# Patient Record
Sex: Female | Born: 1970 | Race: White | Hispanic: No | Marital: Married | Smoking: Former smoker
Health system: Southern US, Community
[De-identification: ages and names within clinical notes are randomized; demographics above are authoritative.]

## PROBLEM LIST (undated history)

## (undated) DIAGNOSIS — K602 Anal fissure, unspecified: Secondary | ICD-10-CM

## (undated) DIAGNOSIS — E669 Obesity, unspecified: Secondary | ICD-10-CM

## (undated) DIAGNOSIS — I10 Essential (primary) hypertension: Secondary | ICD-10-CM

## (undated) DIAGNOSIS — K589 Irritable bowel syndrome without diarrhea: Secondary | ICD-10-CM

## (undated) DIAGNOSIS — G4734 Idiopathic sleep related nonobstructive alveolar hypoventilation: Secondary | ICD-10-CM

## (undated) HISTORY — DX: Anal fissure, unspecified: K60.2

## (undated) HISTORY — DX: Irritable bowel syndrome, unspecified: K58.9

## (undated) HISTORY — DX: Obesity, unspecified: E66.9

## (undated) HISTORY — PX: COLONOSCOPY: SHX174

## (undated) HISTORY — DX: Essential (primary) hypertension: I10

---

## 2004-11-01 ENCOUNTER — Ambulatory Visit (HOSPITAL_COMMUNITY): Admission: RE | Admit: 2004-11-01 | Discharge: 2004-11-01 | Payer: Self-pay | Admitting: Family Medicine

## 2007-12-26 HISTORY — PX: TUBAL LIGATION: SHX77

## 2008-09-22 ENCOUNTER — Ambulatory Visit (HOSPITAL_COMMUNITY): Admission: RE | Admit: 2008-09-22 | Discharge: 2008-09-22 | Payer: Self-pay | Admitting: Family Medicine

## 2008-09-22 ENCOUNTER — Encounter (INDEPENDENT_AMBULATORY_CARE_PROVIDER_SITE_OTHER): Payer: Self-pay | Admitting: Family Medicine

## 2013-12-25 DIAGNOSIS — D229 Melanocytic nevi, unspecified: Secondary | ICD-10-CM

## 2013-12-25 HISTORY — DX: Melanocytic nevi, unspecified: D22.9

## 2016-08-30 ENCOUNTER — Other Ambulatory Visit (HOSPITAL_COMMUNITY): Payer: Self-pay | Admitting: Internal Medicine

## 2016-08-30 DIAGNOSIS — R899 Unspecified abnormal finding in specimens from other organs, systems and tissues: Secondary | ICD-10-CM

## 2016-09-05 ENCOUNTER — Ambulatory Visit (HOSPITAL_COMMUNITY)
Admission: RE | Admit: 2016-09-05 | Discharge: 2016-09-05 | Disposition: A | Discharge status: Home / Self Care | Payer: BLUE CROSS/BLUE SHIELD | Source: Ambulatory Visit | Attending: Internal Medicine | Admitting: Internal Medicine

## 2016-09-05 DIAGNOSIS — R899 Unspecified abnormal finding in specimens from other organs, systems and tissues: Secondary | ICD-10-CM

## 2017-06-01 IMAGING — US US THYROID
1 series · 13 of 25 positions shown · non-contrast
Comparison: None.

CLINICAL DATA: 45-year-old female with abnormal thyroid labs

EXAM:
THYROID ULTRASOUND
TECHNIQUE: Ultrasound examination of the thyroid gland and adjacent soft
tissues was performed.

[Series 1: us thyroid · 0.09mm/px · 13 of 53 slices shown]
[im 1/53]
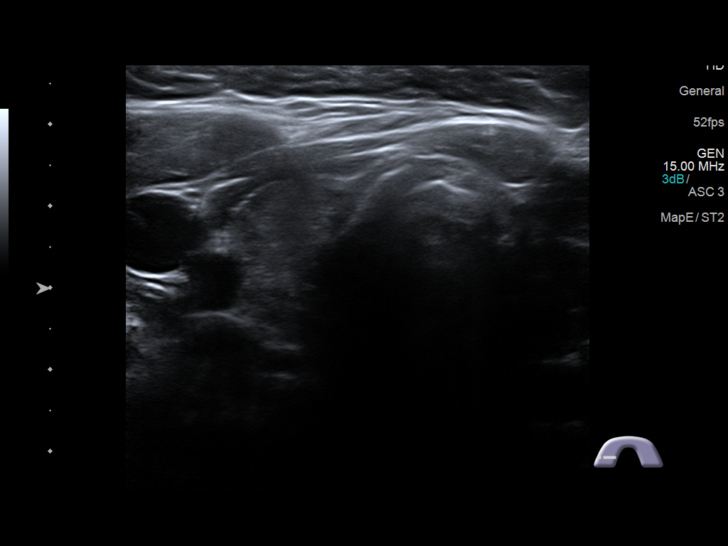
[im 5/53]
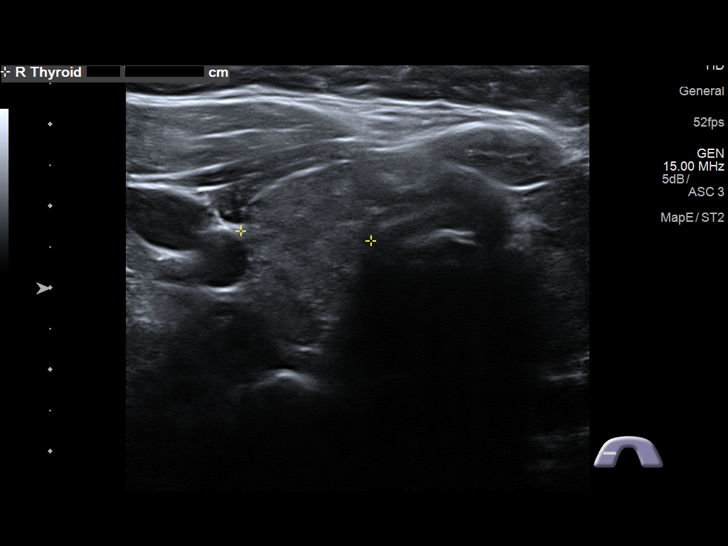
[im 9/53]
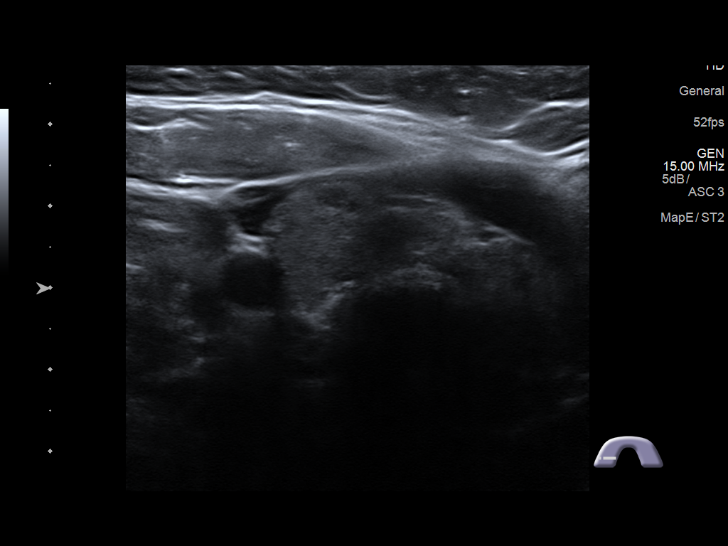
[im 14/53]
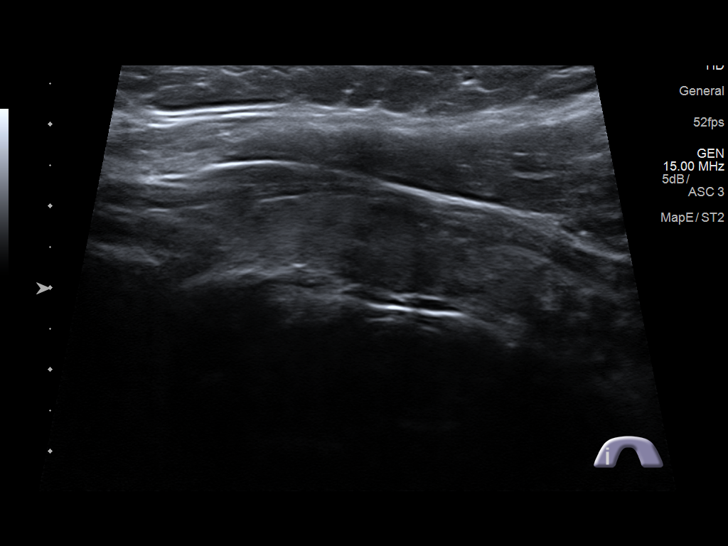
[im 18/53]
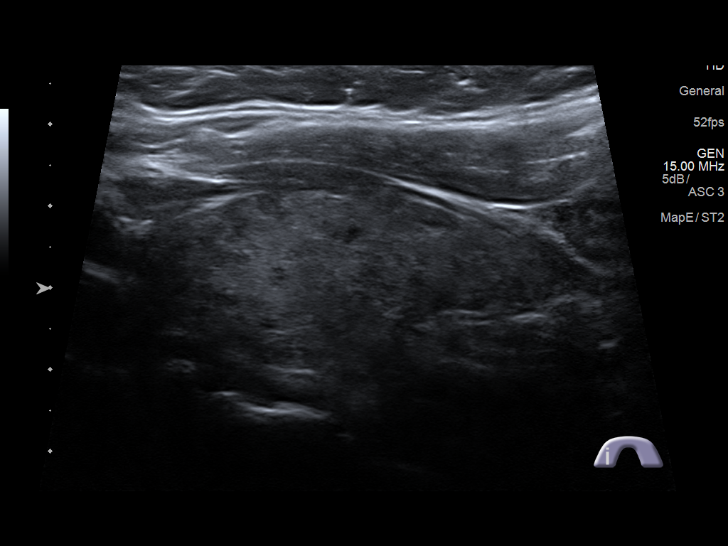
[im 22/53]
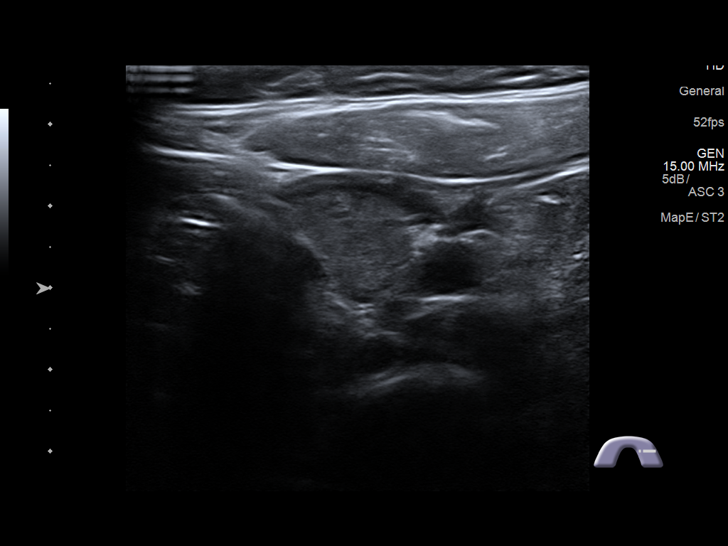
[im 27/53]
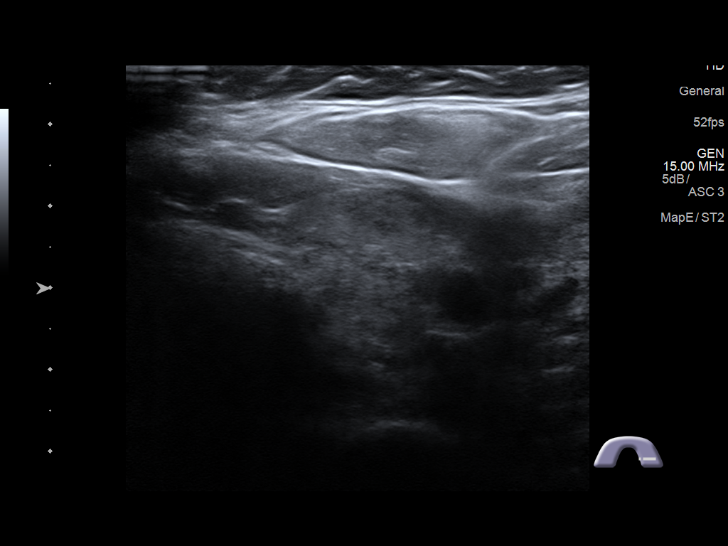
[im 31/53]
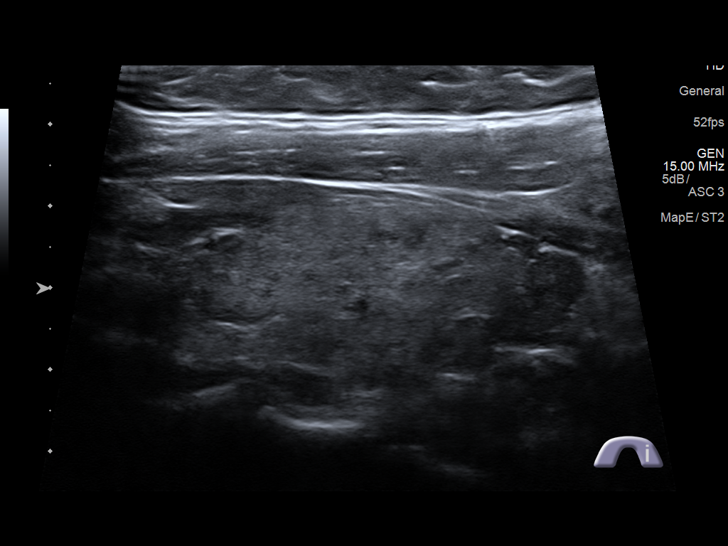
[im 35/53]
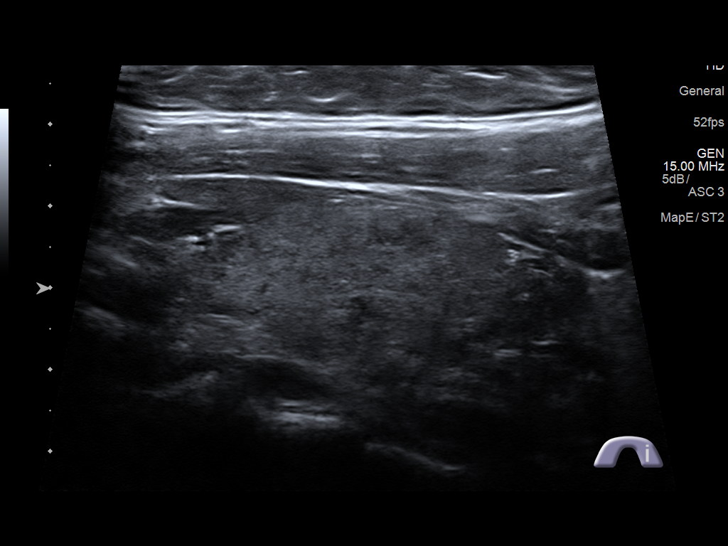
[im 40/53]
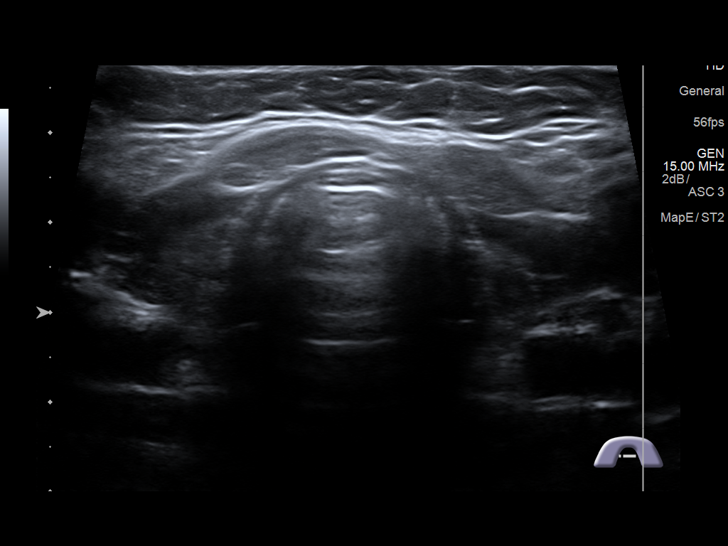
[im 44/53]
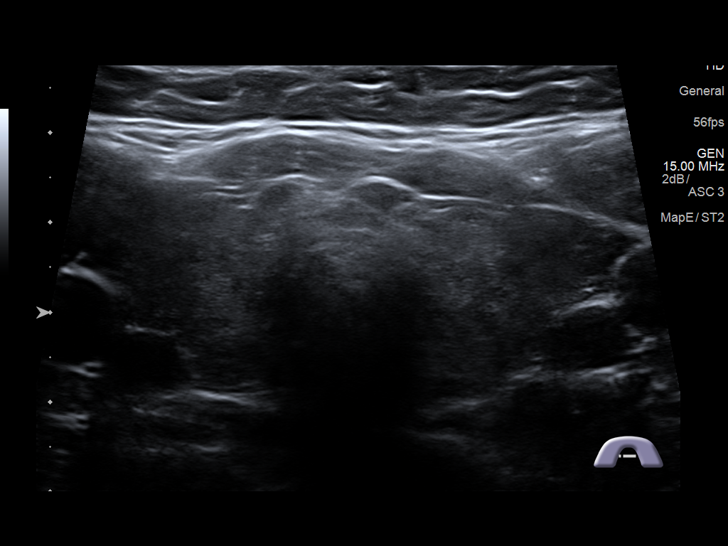
[im 48/53]
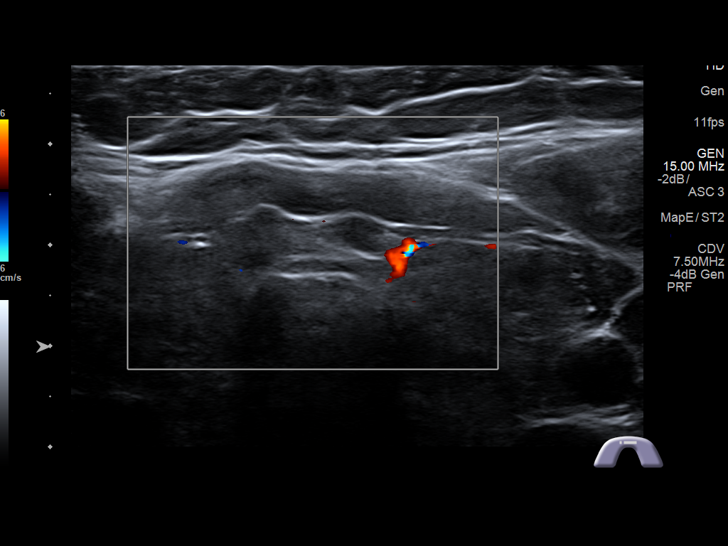
[im 53/53]
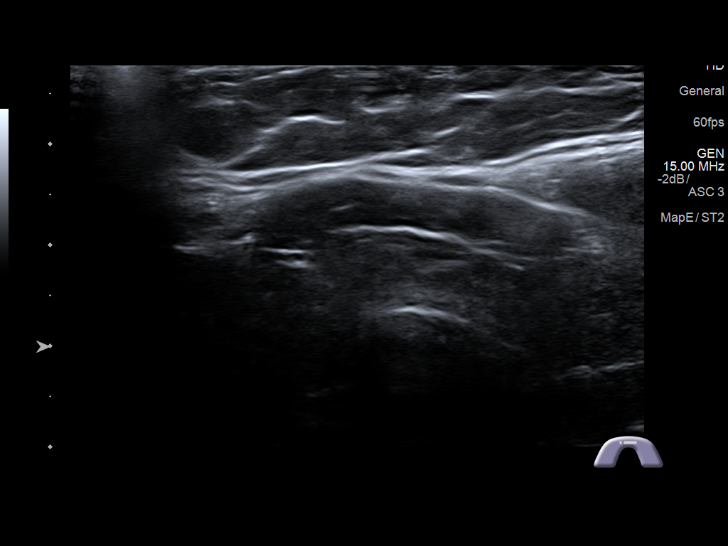

[13 of 25 positions shown; findings below may reference images not displayed]

FINDINGS: Parenchymal Echotexture: Markedly heterogenous

Estimated total number of nodules >/= 1 cm: 0

Number of spongiform nodules > 2 cm not described below (TR1): 0

Number of mixed cystic and solid nodules > 1.5 cm not described
below (TR2): 0

_________________________________________________________

Isthmus: 0.7 cm

Nodule # 1:

Location: Isthmus; Inferior

Size: 0.5 x 0.3 x 0.8 cm

Composition: solid/almost completely solid (2)

Echogenicity: hypoechoic (2)

Shape: not taller-than-wide (0)

Margins: smooth (0)

Echogenic foci: none (0)

ACR TI-RADS total points: 4.

ACR TI-RADS risk category: TR4 (4-6 points).

ACR TI-RADS recommendations:

Given size (<0.9 cm) and appearance, this nodule does NOT meet
TI-RADS criteria for biopsy or dedicated follow-up.

_________________________________________________________

Right lobe: 5.4 x 2.6 x 1.6 cm

No discrete nodules are identified within the right lobe of the
thyroid.

_________________________________________________________

Left lobe: 5.4 x 2.9 x 2.0 cm

No discrete nodules are identified within the left lobe of the
thyroid.
IMPRESSION: 1. Markedly heterogeneous thyroid gland may represent chronic
thyroiditis or diffuse goitrous change.
2. One incidentally detected sub cm nodule in the thyroid isthmus
does not meet criteria for biopsy 0 4 dedicated follow-up imaging.

The above is in keeping with the ACR TI-RADS recommendations - [HOSPITAL] 6985;[DATE].

## 2020-05-18 ENCOUNTER — Encounter: Payer: Self-pay | Admitting: Gastroenterology

## 2020-06-23 ENCOUNTER — Encounter: Payer: Self-pay | Admitting: Gastroenterology

## 2020-06-23 ENCOUNTER — Other Ambulatory Visit (INDEPENDENT_AMBULATORY_CARE_PROVIDER_SITE_OTHER): Payer: Commercial Managed Care - PPO

## 2020-06-23 ENCOUNTER — Ambulatory Visit (INDEPENDENT_AMBULATORY_CARE_PROVIDER_SITE_OTHER): Payer: Commercial Managed Care - PPO | Admitting: Gastroenterology

## 2020-06-23 VITALS — BP 142/82 | HR 63 | Ht 65.0 in | Wt 259.0 lb

## 2020-06-23 DIAGNOSIS — K602 Anal fissure, unspecified: Secondary | ICD-10-CM

## 2020-06-23 DIAGNOSIS — K580 Irritable bowel syndrome with diarrhea: Secondary | ICD-10-CM

## 2020-06-23 LAB — IGA: IgA: 193 mg/dL (ref 68–378)

## 2020-06-23 MED ORDER — HYOSCYAMINE SULFATE 0.125 MG SL SUBL: 0.1250 mg | SUBLINGUAL_TABLET | Freq: Every day | SUBLINGUAL | 2 refills | Status: DC

## 2020-06-23 NOTE — Progress Notes (Addendum)
[facility] Consult Note:  History: Katie Moore 06/23/2020  Referring provider: Celene Squibb, MD  Reason for consult/chief complaint: Irritable Bowel Syndrome (with diarrhea), Diarrhea, and Rectal Pain (hx of anal fissures)   Subjective  HPI: This is a very pleasant 49 year old woman referred by primary care for chronic digestive issues and history of anal fissure. For at least 5 years she has been bothered by bloating, diarrhea and indigestion. In 2018 the diarrhea was bothering her so much that she developed an anal fissure and saw Dr. Earlean Shawl. Colonoscopy was performed at that time that she recalls was normal, she does not know if any biopsies were taken. She was prescribed nitroglycerin ointment for the fissure, and would intermittently take dicyclomine or hyoscyamine for relief of symptoms. She tried a low gluten diet but is not sure she saw much improvement. Symptoms seem to improve until sometime earlier this year when she started a new diet including some supplements. More diarrhea was occurring, so she stopped that diet and the supplements. At this point, her typical bowel pattern is 1-2 BMs in the morning, though some days it may start to become more loose later in the day. Some days she feels well overall, other times has loose stool preceded by feelings of lower abdominal pressure. After bowel movement she might feel incompletely evacuated. It has been frustrating because Jashanti is not sure if there are specific food triggers. She takes the antispasmodic medication occasionally, but after symptoms have occurred and she is not sure how much it helps. She is concerned that the symptoms may escalate to the point she would get another fissure.   ROS:  Review of Systems  Constitutional: Negative for appetite change and unexpected weight change.  HENT: Negative for mouth sores and voice change.   Eyes: Negative for pain and redness.  Respiratory: Negative for  cough and shortness of breath.   Cardiovascular: Negative for chest pain and palpitations.  Genitourinary: Negative for dysuria and hematuria.  Musculoskeletal: Negative for arthralgias and myalgias.  Skin: Negative for pallor and rash.  Neurological: Negative for weakness and headaches.  Hematological: Negative for adenopathy.     Past Medical History: Past Medical History:  Diagnosis Date  . Anal fissure   . Hypertension   . IBS (irritable bowel syndrome)    with diarrhea  . Obesity      Past Surgical History: Past Surgical History:  Procedure Laterality Date  . CESAREAN SECTION     x 2  . TUBAL LIGATION  2009     Family History: Family History  Problem Relation Age of Onset  . Heart disease Father   . Prostate cancer Maternal Grandfather   . Ulcerative colitis Maternal Grandfather   . Heart disease Paternal Grandfather     Social History: Social History   Socioeconomic History  . Marital status: Married    Spouse name: Not on file  . Number of children: Not on file  . Years of education: Not on file  . Highest education level: Not on file  Occupational History  . Not on file  Tobacco Use  . Smoking status: Former Research scientist (life sciences)  . Smokeless tobacco: Never Used  Vaping Use  . Vaping Use: Never used  Substance and Sexual Activity  . Alcohol use: Not Currently  . Drug use: Never  . Sexual activity: Not on file  Other Topics Concern  . Not on file  Social History Narrative  . Not on file   Social  Determinants of Health   Financial Resource Strain:   . Difficulty of Paying Living Expenses:   Food Insecurity:   . Worried About Charity fundraiser in the Last Year:   . Arboriculturist in the Last Year:   Transportation Needs:   . Film/video editor (Medical):   Marland Kitchen Lack of Transportation (Non-Medical):   Physical Activity:   . Days of Exercise per Week:   . Minutes of Exercise per Session:   Stress:   . Feeling of Stress :   Social Connections:     . Frequency of Communication with Friends and Family:   . Frequency of Social Gatherings with Friends and Family:   . Attends Religious Services:   . Active Member of Clubs or Organizations:   . Attends Archivist Meetings:   Marland Kitchen Marital Status:     Allergies: Allergies  Allergen Reactions  . Keflex [Cephalexin]   . Penicillins     Outpatient Meds: Current Outpatient Medications  Medication Sig Dispense Refill  . AMBULATORY NON FORMULARY MEDICATION Medication Name: Nitroglycerin 0.2% ointment uses pea sized amount to rectum every night at bedtime    . Cholecalciferol (D3-1000 PO) Take by mouth. Take once daily    . esomeprazole (NEXIUM) 20 MG capsule Take 20 mg by mouth daily at 12 noon. Takes every night at bedtime    . hydrochlorothiazide (MICROZIDE) 12.5 MG capsule Take 12.5 mg by mouth daily.    Marland Kitchen lisinopril (ZESTRIL) 10 MG tablet Take 10 mg by mouth daily.    . hyoscyamine (LEVSIN SL) 0.125 MG SL tablet Place 1 tablet (0.125 mg total) under the tongue daily. 30 tablet 2   No current facility-administered medications for this visit.      ___________________________________________________________________ Objective   Exam:  BP (!) 142/82 (BP Location: Left Arm, Patient Position: Sitting)   Pulse 63   Ht 5\' 5"  (1.651 m)   Wt 259 lb (117.5 kg)   SpO2 99%   BMI 43.10 kg/m    General: Well-appearing, pleasant  Eyes: sclera anicteric, no redness  ENT: oral mucosa moist without lesions, no cervical or supraclavicular lymphadenopathy  CV: RRR without murmur, S1/S2, no JVD, no peripheral edema  Resp: clear to auscultation bilaterally, normal RR and effort noted  GI: soft, no tenderness, with active bowel sounds. No guarding or palpable organomegaly noted.  Skin; warm and dry, no rash or jaundice noted  Neuro: awake, alert and oriented x 3. Normal gross motor function and fluent speech  Labs:  No data for review at today's  visit  Assessment: Encounter Diagnoses  Name Primary?  . Irritable bowel syndrome with diarrhea Yes  . 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 Less likely bile acid diarrhea, no apparent risk factors for SIBO. The chronic nature and treatment of IBS were discussed. The cause is not completely understood, but is likely to be a combination of genetics, diet, stress, visceral hypersensitivity and the gut microbiome.  The available treatments aim to control symptoms even if unable to "cure" the condition.  While not physically harmful, IBS can have a significant impact on quality of life.   Plan: Try the hyoscyamine daily, either in the morning or by midday meal. Some written dietary advice given regarding bloating gas and diarrhea Celiac labs Clinic visit in 6 weeks, portal message to me in about 3 weeks with update on symptoms and experience taking the hyoscyamine daily. Perhaps med adjustments may  be necessary before the office visit.  Thank you for the courtesy of this consult.  Please call me with any questions or concerns.  Nelida Meuse III  CC: Referring provider noted above ------------------------------------ Record review addendum 06/29/20:  [facility] Campbell 12/13/2017 "screening" colonoscopy - normal to TI except hypertrophied anal papilla(e) and external hemorrhoids.  No Bx taken.  Office note dated 12/04/2017 re: anal pain and suspected fissure.   Wilfrid Lund, MD    [facility]

## 2020-06-23 NOTE — Patient Instructions (Addendum)
If you are age 49 or older, your body mass index should be between 23-30. Your Body mass index is 43.1 kg/m. If this is out of the aforementioned range listed, please consider follow up with your Primary Care Provider.  If you are age 10 or younger, your body mass index should be between 19-25. Your Body mass index is 43.1 kg/m. If this is out of the aformentioned range listed, please consider follow up with your Primary Care Provider.   Your provider has requested that you go to the basement level for lab work before leaving today. Press "B" on the elevator. The lab is located at the first door on the left as you exit the elevator.  Due to recent changes in healthcare laws, you may see the results of your imaging and laboratory studies on MyChart before your provider has had a chance to review them.  We understand that in some cases there may be results that are confusing or concerning to you. Not all laboratory results come back in the same time frame and the provider may be waiting for multiple results in order to interpret others.  Please give [place] 48 hours in order for your provider to thoroughly review all the results before contacting the office for clarification of your results.   Follow up in 6 weeks   It was a pleasure to see you today!  Dr. Loletha Carrow   Food Guidelines for those with chronic digestive trouble:  Many people have difficulty digesting certain foods, causing a variety of distressing and embarrassing symptoms such as abdominal pain, bloating and gas.  These foods may need to be avoided or consumed in small amounts.  Here are some tips that might be helpful for you.  1.   Lactose intolerance is the difficulty or complete inability to digest lactose, the natural sugar in milk and anything made from milk.  This condition is harmless, common, and can begin any time during life.  Some people can digest a modest amount of lactose while others cannot tolerate any.  Also, not all dairy  products contain equal amounts of lactose.  For example, hard cheeses such as parmesan have less lactose than soft cheeses such as cheddar.  Yogurt has less lactose than milk or cheese.  Many packaged foods (even many brands of bread) have milk, so read ingredient lists carefully.  It is difficult to test for lactose intolerance, so just try avoiding lactose as much as possible for a week and see what happens with your symptoms.  If you seem to be lactose intolerant, the best plan is to avoid it (but make sure you get calcium from another source).  The next best thing is to use lactase enzyme supplements, available over the counter everywhere.  Just know that many lactose intolerant people need to take several tablets with each serving of dairy to avoid symptoms.  Lastly, a lot of restaurant food is made with milk or butter.  Many are things you might not suspect, such as mashed potatoes, rice and pasta (cooked with butter) and "grilled" items.  If you are lactose intolerant, it never hurts to ask your server what has milk or butter.  2.   Fiber is an important part of your diet, but not all fiber is well-tolerated.  Insoluble fiber such as bran is often consumed by normal gut bacteria and converted into gas.  Soluble fiber such as oats, squash, carrots and green beans are typically tolerated better.  3.  Some types of carbohydrates can be poorly digested.  Examples include: fructose (apples, cherries, pears, raisins and other dried fruits), fructans (onions, zucchini, large amounts of wheat), sorbitol/mannitol/xylitol and sucralose/Splenda (common artificial sweeteners), and raffinose (lentils, broccoli, cabbage, asparagus, brussel sprouts, many types of beans).  Do a Development worker, community for [employer] and you will find helpful information. Beano, a dietary supplement, will often help with raffinose-containing foods.  As with lactase tablets, you may need several per serving.  4.   Whenever possible, avoid  processed food&meats and chemical additives.  High fructose corn syrup, a common sweetener, may be difficult to digest.  Eggs and soy (comes from the soybean, and added to many foods now) are other common bloating/gassy foods.  5.  Regarding gluten:  gluten is a protein mainly found in wheat, but also rye and barley.  There is a condition called celiac sprue, which is an inflammatory reaction in the small intestine causing a variety of digestive symptoms.  Blood testing is highly reliable to look for this condition, and sometimes upper endoscopy with small bowel biopsies may be necessary to make the diagnosis.  Many patients who test negative for celiac sprue report improvement in their digestive symptoms when they switch to a gluten-free diet.  However, in these "non-celiac gluten sensitive" patients, the true role of gluten in their symptoms is unclear.  Reducing carbohydrates in general may decrease the gas and bloating caused when gut bacteria consume carbs. Also, some of these patients may actually be intolerant of the baker's yeast in bread products rather than the gluten.  Flatbread and other reduced yeast breads might therefore be tolerated.  There is no specific testing available for most food intolerances, which are discovered mainly by dietary elimination.  Please do not embark on a gluten free diet unless directed by your doctor, as it is highly restrictive, and may lead to nutritional deficiencies if not carefully monitored.  Lastly, beware of internet claims offering "personalized" tests for food intolerances.  Such testing has no reliable scientific evidence to support its reliability and correlation to symptoms.    6.  The best advice is old advice, especially for those with chronic digestive trouble - try to eat "clean".  Balanced diet, avoid processed food, plenty of fruits and vegetables, cut down the sugar, minimal alcohol, avoid tobacco. Make time to care for yourself, get enough sleep,  exercise when you can, reduce stress.  Your guts will thank you for it.   - Dr. [facility]

## 2020-06-24 LAB — TISSUE TRANSGLUTAMINASE, IGA: (tTG) Ab, IgA: 1 U/mL

## 2020-09-08 ENCOUNTER — Ambulatory Visit (INDEPENDENT_AMBULATORY_CARE_PROVIDER_SITE_OTHER): Payer: Commercial Managed Care - PPO | Admitting: Gastroenterology

## 2020-09-08 ENCOUNTER — Encounter: Payer: Self-pay | Admitting: Gastroenterology

## 2020-09-08 VITALS — BP 120/80 | HR 72 | Ht 65.0 in | Wt 259.0 lb

## 2020-09-08 DIAGNOSIS — K529 Noninfective gastroenteritis and colitis, unspecified: Secondary | ICD-10-CM

## 2020-09-08 DIAGNOSIS — K602 Anal fissure, unspecified: Secondary | ICD-10-CM

## 2020-09-08 DIAGNOSIS — K6289 Other specified diseases of anus and rectum: Secondary | ICD-10-CM | POA: Diagnosis not present

## 2020-09-08 MED ORDER — AMBULATORY NON FORMULARY MEDICATION: 1 refills | Status: DC

## 2020-09-08 NOTE — Progress Notes (Addendum)
[facility] Progress Note  Chief Complaint: Anal pain  Subjective  History: Seen for initial office consult 06/23/2020 with years of chronic diarrhea and prior anal fissure under care of Dr. Earlean Shawl in 2018. Celiac labs negative, written dietary advice given, trial of hyoscyamine prescribed. She has not felt better since the last visit, and still has urgent diarrhea with rectal pain.  She has intermittent nausea for about 15 minutes in the morning a couple days out of the week.  Records from Dr. Earlean Shawl were reviewed and included as an addendum to the recent office consult note.  Colonoscopy to the terminal ileum December 2018, no biopsies taken, hypertrophied anal papilla.  Subsequent office visit with him described anal fissure.  Katie Moore is feeling about the same since I last saw her.  Her diarrhea is relatively mild, she has a formed bowel movement in the morning followed by 1 or 2 loose stools within the next couple of hours.  That is been her pattern for years.  After the second or third BM she starts getting anal pain and spasm that may last for several hours and is very uncomfortable.  She has been taking some ibuprofen every morning with some relief.  She has occasional nausea without vomiting.  Denies rectal bleeding.  ROS: Cardiovascular:  no chest pain Respiratory: no dyspnea  The patient's Past Medical, Family and Social History were reviewed and are on file in the EMR.  Objective:  Med list reviewed  Current Outpatient Medications:  .  Cholecalciferol (D3-1000 PO), Take by mouth. Take once daily, Disp: , Rfl:  .  esomeprazole (NEXIUM) 20 MG capsule, Take 20 mg by mouth daily at 12 noon. Takes every night at bedtime, Disp: , Rfl:  .  hydrochlorothiazide (MICROZIDE) 12.5 MG capsule, Take 12.5 mg by mouth daily., Disp: , Rfl:  .  hyoscyamine (LEVSIN SL) 0.125 MG SL tablet, Place 1 tablet (0.125 mg total) under the tongue daily., Disp: 30 tablet, Rfl: 2 .  lisinopril  (ZESTRIL) 10 MG tablet, Take 10 mg by mouth daily., Disp: , Rfl:    Vital signs in last 24 hrs: Vitals:   09/08/20 0916  BP: 120/80  Pulse: 72    Physical Exam   HEENT: sclera anicteric, oral mucosa moist without lesions  Neck: supple, no thyromegaly, JVD or lymphadenopathy  Cardiac: RRR without murmurs, S1S2 heard, no peripheral edema  Pulm: clear to auscultation bilaterally, normal RR and effort noted  Abdomen: soft, no tenderness, with active bowel sounds. No guarding or palpable hepatosplenomegaly.  Skin; warm and dry, no jaundice or rash Rectal: Sentinel tag, tender (and probably deep) posterior anal fissure.  I was able to complete DRE Labs:   ___________________________________________ Radiologic studies:   ____________________________________________ OtherEarlean Shawl records as noted above _____________________________________________ Assessment & Plan  Assessment: Encounter Diagnoses  Name Primary?  Marland Kitchen Anal fissure Yes  . Chronic diarrhea   . Anal pain     I think she has mild IBS with some loose stool in the morning after her first formed stool, but no chronic abdominal pain. The biggest problem is this anal fissure which has probably been present at least intermittently for years.  It has been causing severe pain over least the last 6 months.  We discussed the nature of anal fissure and available treatments.  She has been using some nitroglycerin ointment prescribed by primary care but no lidocaine.  Plan: Instructions given for combination of lidocaine and nitroglycerin ointment.  Try 1  Imodium tablet in the morning for the next week.  I think that is unlikely to cause constipation, but if it does she will stop.  Referral to colorectal surgery for examination and consideration of EUA or pelvic MRI if they think there may be a more complex component to this.  30 minutes were spent on this encounter (including chart review, history/exam,  counseling/coordination of care, and documentation)  Katie Moore   Addendum 09/24/20:  Patient was seen by Dr. Marcello Moores at [place], who felt the fissure was improving and did not need additional therapy or testing.  Katie Lips, MD

## 2020-09-08 NOTE — Patient Instructions (Signed)
If you are age 49 or older, your body mass index should be between 23-30. Your Body mass index is 43.1 kg/m. If this is out of the aforementioned range listed, please consider follow up with your Primary Care Provider.  If you are age 60 or younger, your body mass index should be between 19-25. Your Body mass index is 43.1 kg/m. If this is out of the aformentioned range listed, please consider follow up with your Primary Care Provider.   We have sent a prescription for nitroglycerin 0.125% ointment to [facility]. You should apply a pea size amount to your rectum three times daily x 6-8 weeks.  Please use Recticare over the counter.  [facility]'s information is below: Address: [address] [place], [place] [address]  [phone]  *Please DO NOT go directly from our office to pick up this medication! Give the pharmacy 1 day to process the prescription as this is compounded and takes time to make.   It was a pleasure to see you today!  Dr. Loletha Carrow   Patient Drug Education for Nitroglycerin Ointment  Nitroglycerin ointment (NTG) is used to help heal anal fissures. The ointment relaxes the smooth muscle around the anus and promotes blood flow which helps heal the fissure (tear). The NTG reduces anal canal pressure, which diminishes pain and spasm. We use a diluted concentration of NTG (.125%) compared to the 2% that is typically used for heart patients, and this is why you need to obtain the medication from a pharmacy which will compound your prescription.  The NTG ointment should be applied 3 times per day, or as directed.  A pea-sized drop should be placed on the tip of your finger and then gently placed inside the anus. The finger should be inserted 1/3 - 1/2 its length and may be covered with a plastic glove or finger cot. You may use Vaseline to help coat the finger or dilute the ointment. If you are advised to mix the NTG with steroid ointment, limit the  steroids to one to two weeks.  The first few applications should be taken lying down, as mild light-headedness or a brief headache may occur.  It may take several weeks for the fissure to begin healing, and you will need to continue taking the medication after resolution of your symptoms.  It is important to continue the treatment for the entire time period - up to 3 months or as directed. It takes up to two years for the healing tissue to regain the normal skin strength. You will be advised to add fiber to your diet, increase water intake to 7-8 glasses per day, take relaxing baths or sitz baths, and avoid prolonged sitting and straining on the commode. Local anesthetic ointment may be added.  Initially, the anal fissure is very inflamed, which allows more of the NTG to get into the blood. This allows for a higher incidence of the most common side effect - a headache. It is usually brief and mild, but may require Tylenol or Advil. You may dilute the NTG further with Vaseline to decrease the headaches. As the treatment progresses and the fissure begins to heal, the headaches will tend to dissipate. Other side effects include lightheadedness, flushing, dizziness, nervousness, nausea, and vomiting. If any of these side effects persist or worsen, notify us promptly. Stop using the NTG and notify us immediately if you develop the rare side effects of severe dizziness, fainting, fast/pounding heartbeat, paleness, sweating, blurred vision, dry  mouth, dark urine, bluish lips/skin/nails, unusual tiredness, severe weakness, irregular heartbeat, seizures, or chest pain. Serious allergic reactions are unusual, but seek immediate medical attention if you develop a rash, swelling, dizziness, or trouble breathing.  Tell us if you are allergic to nitrates, have severe anemia, low blood pressure, dehydration, chronic heart failure, cardiomyopathy, recent heart attack, increased pressure in the brain, or exposure to  nitrates while on the job. Do not use NTG while driving or working around machinery if you are drowsy, dizzy, have lightheadedness, or blurred vision. Limit alcoholic beverages. To minimize dizziness and lightheadedness, get up slowly when rising from a sitting or lying position. The elderly may be more prone to dizziness and falling. While there are not adequate studies to confirm the safety of NTG in pregnant or breast feeding women, it has been used without incident so far. We recommend waiting at least one hour after applying the NTG ointment before breast feeding.  Do not use NTG ointment if you are taking drugs for sexual problems [e.g., sildenafil (Viagra), tadalafil (Cialis), vardenafil (Levitra)]. Use caution before taking cough-and-cold products, diet aids, or NSAIDs preparations because they may contain ingredients that could increase your blood pressure, cause a fast heartbeat, or increase chest pain (e.g., pseudoephedrine, phenylephrine, chlorpheniramine, diphenhydramine, clemastine, ibuprofen, and naproxen). Tell us if you drink alcohol, take alteplase, migraine drugs (ergotamine), water pills/diuretics such as furosemide or hydrochlorothiazide, or other drugs for high blood pressure (beta blockers, calcium channel blockers, ACE inhibitors).  Store the NTG at room temperature and keep away from light and moisture. Close the container tightly after each use. Do not store in the bathroom. Keep away from children and pets. If you have any questions or problems please call us at  [phone].

## 2020-09-22 ENCOUNTER — Other Ambulatory Visit: Payer: Self-pay

## 2020-09-22 MED ORDER — HYOSCYAMINE SULFATE 0.125 MG SL SUBL: 0.1250 mg | SUBLINGUAL_TABLET | Freq: Every day | SUBLINGUAL | 2 refills | Status: DC

## 2021-01-17 MED ORDER — HYOSCYAMINE SULFATE 0.125 MG SL SUBL: 0.1250 mg | SUBLINGUAL_TABLET | Freq: Every day | SUBLINGUAL | 2 refills | Status: DC

## 2021-06-13 ENCOUNTER — Ambulatory Visit
Admission: EM | Admit: 2021-06-13 | Discharge: 2021-06-13 | Disposition: A | Discharge status: Home / Self Care | Payer: Commercial Managed Care - PPO | Attending: Family Medicine | Admitting: Family Medicine

## 2021-06-13 ENCOUNTER — Encounter: Payer: Self-pay | Admitting: Emergency Medicine

## 2021-06-13 ENCOUNTER — Other Ambulatory Visit: Payer: Self-pay

## 2021-06-13 DIAGNOSIS — L02211 Cutaneous abscess of abdominal wall: Secondary | ICD-10-CM

## 2021-06-13 MED ORDER — DOXYCYCLINE HYCLATE 100 MG PO CAPS: 100.0000 mg | ORAL_CAPSULE | Freq: Two times a day (BID) | ORAL | 0 refills | Status: AC

## 2021-06-13 NOTE — ED Triage Notes (Signed)
Area to RT stomach at waistband that is red, painful and has been draining pus for past couple of days.  Area has been there x 1 month.  Has taken 1 round of abx about a month ago.

## 2021-06-13 NOTE — Discharge Instructions (Addendum)
Start doxycycline twice a day for 14 days.  I have placed a referral for you to be evaluated by Dr. Aviva Signs at [facility] surgical specialty here in [place].  Someone from their office will contact you to schedule you for an appointment.  In the meantime keep area covered.  Given the incision sites I expect the abscess to drain although it may simply be blood as I was unable to retrieve much purulent drainage today. You may continue to shower and bathe as usual and redress incision sites after bathing.

## 2021-06-13 NOTE — ED Provider Notes (Signed)
[facility]    CSN: [id number] Arrival date & time: 06/13/21  1424      History   Chief Complaint Chief Complaint  Patient presents with  . Abscess    HPI Katie Moore is a 50 y.o. female.   HPI Patient presents today with a abdominal wall abscess which started as a small dime size mass on her right lower abdomen.  Over the last 3 months the area has expanded and become hardened.  She reports redness and irritation along with some mild pain started approximately few weeks ago when her occupational health NP treated her with 7 days of doxycycline.  [place] the [place] the area became increasingly red and she saw scants amount of pus draining from the site.  The area has expanded in diameter continues experience moderate pain at the site and has had some intermittent pus draining from the site.  She is afebrile denies any nausea or vomiting.  She is not a diabetic.  Past Medical History:  Diagnosis Date  . Anal fissure   . Hypertension   . IBS (irritable bowel syndrome)    with diarrhea  . Obesity     There are no problems to display for this patient.   Past Surgical History:  Procedure Laterality Date  . CESAREAN SECTION     x 2  . TUBAL LIGATION  2009    OB History   No obstetric history on file.      Home Medications    Prior to Admission medications   Medication Sig Start Date End Date Taking? Authorizing Provider  doxycycline (VIBRAMYCIN) 100 MG capsule Take 1 capsule (100 mg total) by mouth 2 (two) times daily for 14 days. 06/13/21 06/27/21 Yes Scot Jun, FNP  AMBULATORY NON FORMULARY MEDICATION Nitroglycerine ointment 0.125 %  Apply a pea sized amount internally four times daily. Dispense 30 GM 1 refill 09/08/20   Doran Stabler, MD  Cholecalciferol (D3-1000 PO) Take by mouth. Take once daily    [provider]  esomeprazole (NEXIUM) 20 MG capsule Take 20 mg by mouth daily at 12 noon. Takes every night at bedtime     [provider]  hydrochlorothiazide (MICROZIDE) 12.5 MG capsule Take 12.5 mg by mouth daily.    [provider]  hyoscyamine (LEVSIN SL) 0.125 MG SL tablet Place 1 tablet (0.125 mg total) under the tongue daily. 01/17/21   Nelida Meuse III, MD  lisinopril (ZESTRIL) 10 MG tablet Take 10 mg by mouth daily.    [provider]    Family History Family History  Problem Relation Age of Onset  . Heart disease Father   . Prostate cancer Maternal Grandfather   . Ulcerative colitis Maternal Grandfather   . Heart disease Paternal Grandfather     Social History Social History   Tobacco Use  . Smoking status: Former    Pack years: 0.00  . Smokeless tobacco: Never  Vaping Use  . Vaping Use: Never used  Substance Use Topics  . Alcohol use: Not Currently  . Drug use: Never     Allergies   Keflex [cephalexin] and Penicillins   Review of Systems Review of Systems Pertinent negatives listed in HPI  Physical Exam Triage Vital Signs ED Triage Vitals  Enc Vitals Group     BP 06/13/21 1452 (!) 163/89     Pulse Rate 06/13/21 1452 75     Resp 06/13/21 1452 19  Temp 06/13/21 1452 99 F (37.2 C)     Temp Source 06/13/21 1452 Oral     SpO2 06/13/21 1452 97 %     Weight --      Height --      Head Circumference --      Peak Flow --      Pain Score 06/13/21 1450 0     Pain Loc --      Pain Edu? --      Excl. in [place]? --    No data found.  Updated Vital Signs BP (!) 163/89 (BP Location: Right Arm)   Pulse 75   Temp 99 F (37.2 C) (Oral)   Resp 19   LMP 05/20/2021   SpO2 97%   Visual Acuity Right Eye Distance:   Left Eye Distance:   Bilateral Distance:    Right Eye Near:   Left Eye Near:    Bilateral Near:     Physical Exam General appearance: alert, well developed, well nourished, cooperative  Head: Normocephalic, without obvious abnormality, atraumatic Respiratory: Respirations even and unlabored, normal respiratory rate Heart: rate  and rhythm normal. No gallop or murmurs noted on exam  Abdomen: Annular shaped abscess mass lesion with areas of induration and fluctuance scant purulent translucency present Extremities: No gross deformities Skin: Skin color, texture, turgor normal. No rashes seen  Psych: Appropriate mood and affect. Neurologic: Mental status: Alert, oriented to person, place, and time, thought content appropriate.   UC Treatments / Results  Labs (all labs ordered are listed, but only abnormal results are displayed) Labs Reviewed - No data to display  EKG   Radiology No results found.  Procedures Incision and Drainage  Date/Time: 06/13/2021 3:55 PM Performed by: Scot Jun, FNP Authorized by: Scot Jun, FNP   Consent:    Consent obtained:  Verbal   Consent given by:  Patient   Risks discussed:  Bleeding, incomplete drainage and infection   Alternatives discussed:  No treatment Universal protocol:    Patient identity confirmed:  Verbally with patient Location:    Type:  Abscess   Location:  Trunk   Trunk location:  Abdomen Pre-procedure details:    Skin preparation:  Povidone-iodine Anesthesia:    Anesthesia method:  Topical application and local infiltration   Topical anesthetic:  LET   Local anesthetic:  Lidocaine 2% WITH epi Procedure type:    Complexity:  Complex Procedure details:    Incision types:  Stab incision   Incision depth:  Submucosal   Wound management:  Probed and deloculated and irrigated with saline   Drainage:  Bloody   Drainage amount:  Moderate   Wound treatment:  Wound left open Post-procedure details:    Procedure completion:  Tolerated (including critical care time)  Medications Ordered in UC Medications - No data to display  Initial Impression / Assessment and Plan / UC Course  I have reviewed the triage vital signs and the nursing notes.  Pertinent labs & imaging results that were available during my care of the patient were  reviewed by me and considered in my medical decision making (see chart for details).    Attempted I&D of abdominal wall abscess however only a scant amount of purulent discharge retrieved following 3 incision although patient had significant bleeding with incisions. Referral placed for general surgery as attempted to perform an I&D however suspect that the abscess is deeper within the abdominal wall and unable to open pus pockets here in clinic.  Patient warrants further evaluation by general surgery.  Given the appearance of secondary cellulitis will cover with doxycycline 1 100 mg twice daily for period of 14 days.  Patient encouraged to keep area covered as clothing is likely further irritating the area.  She is afebrile.  Return as needed. Final Clinical Impressions(s) / UC Diagnoses   Final diagnoses:  Abscess of abdominal wall     Discharge Instructions      Start doxycycline twice a day for 14 days.  I have placed a referral for you to be evaluated by Dr. Aviva Signs at [facility] surgical specialty here in [place].  Someone from their office will contact you to schedule you for an appointment.  In the meantime keep area covered.  Given the incision sites I expect the abscess to drain although it may simply be blood as I was unable to retrieve much purulent drainage today. You may continue to shower and bathe as usual and redress incision sites after bathing.   ED Prescriptions     Medication Sig Dispense Auth. Provider   doxycycline (VIBRAMYCIN) 100 MG capsule Take 1 capsule (100 mg total) by mouth 2 (two) times daily for 14 days. 28 capsule Scot Jun, FNP      PDMP not reviewed this encounter.   Scot Jun,  06/13/21 [phone]

## 2021-06-23 ENCOUNTER — Other Ambulatory Visit: Payer: Self-pay

## 2021-06-23 ENCOUNTER — Ambulatory Visit (INDEPENDENT_AMBULATORY_CARE_PROVIDER_SITE_OTHER): Payer: Commercial Managed Care - PPO | Admitting: General Surgery

## 2021-06-23 ENCOUNTER — Encounter: Payer: Self-pay | Admitting: General Surgery

## 2021-06-23 VITALS — BP 162/91 | HR 71 | Temp 98.5°F | Resp 14 | Ht 65.0 in | Wt 258.8 lb

## 2021-06-23 DIAGNOSIS — L723 Sebaceous cyst: Secondary | ICD-10-CM | POA: Diagnosis not present

## 2021-06-23 NOTE — Progress Notes (Signed)
Katie Moore; [id number]; 12-12-1971   HPI Patient is a 50 year old white female who was referred to my care by Pablo Lawrence for evaluation and treatment of an infected sebaceous cyst on the abdominal wall.  Is been present for over 3 months.  Multiple attempts and antibiotics have been tried.  She was actually seen in the emergency room and incision and drainage was not successful.  She has had chronically draining purulent blood-tinged fluid emanating from the skin over the sebaceous cyst.  Occasional sebum has been noted.  The cyst has persisted despite all those efforts. Past Medical History:  Diagnosis Date   Anal fissure    Hypertension    IBS (irritable bowel syndrome)    with diarrhea   Obesity     Past Surgical History:  Procedure Laterality Date   CESAREAN SECTION     x 2   TUBAL LIGATION  2009    Family History  Problem Relation Age of Onset   Heart disease Father    Prostate cancer Maternal Grandfather    Ulcerative colitis Maternal Grandfather    Heart disease Paternal Grandfather     Current Outpatient Medications on File Prior to Visit  Medication Sig Dispense Refill   Cholecalciferol (D3-1000 PO) Take by mouth. Take once daily     doxycycline (VIBRAMYCIN) 100 MG capsule Take 1 capsule (100 mg total) by mouth 2 (two) times daily for 14 days. 28 capsule 0   famotidine (PEPCID) 20 MG tablet Take 20 mg by mouth 2 (two) times daily.     hydrochlorothiazide (MICROZIDE) 12.5 MG capsule Take 12.5 mg by mouth daily.     lisinopril (ZESTRIL) 10 MG tablet Take 10 mg by mouth daily.     No current facility-administered medications on file prior to visit.    Allergies  Allergen Reactions   Keflex [Cephalexin]    Penicillins    Biaxin [Clarithromycin] Rash    Social History   Substance and Sexual Activity  Alcohol Use Not Currently    Social History   Tobacco Use  Smoking Status Former   Pack years: 0.00  Smokeless Tobacco Never    Review of Systems   Constitutional: Negative.   HENT: Negative.    Eyes: Negative.   Respiratory: Negative.    Cardiovascular: Negative.   Gastrointestinal:  Positive for heartburn.  Genitourinary: Negative.   Musculoskeletal: Negative.   Skin: Negative.   Neurological: Negative.   Endo/Heme/Allergies: Negative.   Psychiatric/Behavioral: Negative.     Objective   Vitals:   06/23/21 0854  BP: (!) 162/91  Pulse: 71  Resp: 14  Temp: 98.5 F (36.9 C)  SpO2: 97%    Physical Exam Vitals reviewed.  Constitutional:      Appearance: Normal appearance. She is not ill-appearing.  HENT:     Head: Normocephalic and atraumatic.  Cardiovascular:     Rate and Rhythm: Normal rate and regular rhythm.     Heart sounds: Normal heart sounds. No murmur heard.   No friction rub. No gallop.  Pulmonary:     Effort: Pulmonary effort is normal. No respiratory distress.     Breath sounds: Normal breath sounds. No stridor. No wheezing, rhonchi or rales.  Abdominal:     General: There is no distension.     Palpations: Abdomen is soft. There is mass.     Tenderness: There is no abdominal tenderness. There is no guarding or rebound.     Hernia: No hernia is present.  Comments: 4 cm ovoid subcutaneous mass with a small amount of purulent fluid expressed.  It is tender to touch.  Mild erythema is noted circumferentially.  It does extend down deep in the subcutaneous tissue.  Skin:    General: Skin is warm and dry.  Neurological:     Mental Status: She is alert and oriented to person, place, and time.   ER notes reviewed Assessment  Infected chronic sebaceous cyst, abdominal wall Plan  I do not have the capability of excising the cyst in my office, thus will take the patient to the operating room on 06/29/2021 for excision of the sebaceous cyst of the abdominal wall.  The risks and benefits of the procedure including bleeding and infection were fully explained to the patient, who gave informed consent.

## 2021-06-23 NOTE — H&P (Signed)
Katie Moore; [id number]; 1971-05-24   HPI Patient is a 50 year old white female who was referred to my care by Pablo Lawrence for evaluation and treatment of an infected sebaceous cyst on the abdominal wall.  Is been present for over 3 months.  Multiple attempts and antibiotics have been tried.  She was actually seen in the emergency room and incision and drainage was not successful.  She has had chronically draining purulent blood-tinged fluid emanating from the skin over the sebaceous cyst.  Occasional sebum has been noted.  The cyst has persisted despite all those efforts. Past Medical History:  Diagnosis Date   Anal fissure    Hypertension    IBS (irritable bowel syndrome)    with diarrhea   Obesity     Past Surgical History:  Procedure Laterality Date   CESAREAN SECTION     x 2   TUBAL LIGATION  2009    Family History  Problem Relation Age of Onset   Heart disease Father    Prostate cancer Maternal Grandfather    Ulcerative colitis Maternal Grandfather    Heart disease Paternal Grandfather     Current Outpatient Medications on File Prior to Visit  Medication Sig Dispense Refill   Cholecalciferol (D3-1000 PO) Take by mouth. Take once daily     doxycycline (VIBRAMYCIN) 100 MG capsule Take 1 capsule (100 mg total) by mouth 2 (two) times daily for 14 days. 28 capsule 0   famotidine (PEPCID) 20 MG tablet Take 20 mg by mouth 2 (two) times daily.     hydrochlorothiazide (MICROZIDE) 12.5 MG capsule Take 12.5 mg by mouth daily.     lisinopril (ZESTRIL) 10 MG tablet Take 10 mg by mouth daily.     No current facility-administered medications on file prior to visit.    Allergies  Allergen Reactions   Keflex [Cephalexin]    Penicillins    Biaxin [Clarithromycin] Rash    Social History   Substance and Sexual Activity  Alcohol Use Not Currently    Social History   Tobacco Use  Smoking Status Former   Pack years: 0.00  Smokeless Tobacco Never    Review of Systems   Constitutional: Negative.   HENT: Negative.    Eyes: Negative.   Respiratory: Negative.    Cardiovascular: Negative.   Gastrointestinal:  Positive for heartburn.  Genitourinary: Negative.   Musculoskeletal: Negative.   Skin: Negative.   Neurological: Negative.   Endo/Heme/Allergies: Negative.   Psychiatric/Behavioral: Negative.     Objective   Vitals:   06/23/21 0854  BP: (!) 162/91  Pulse: 71  Resp: 14  Temp: 98.5 F (36.9 C)  SpO2: 97%    Physical Exam Vitals reviewed.  Constitutional:      Appearance: Normal appearance. She is not ill-appearing.  HENT:     Head: Normocephalic and atraumatic.  Cardiovascular:     Rate and Rhythm: Normal rate and regular rhythm.     Heart sounds: Normal heart sounds. No murmur heard.   No friction rub. No gallop.  Pulmonary:     Effort: Pulmonary effort is normal. No respiratory distress.     Breath sounds: Normal breath sounds. No stridor. No wheezing, rhonchi or rales.  Abdominal:     General: There is no distension.     Palpations: Abdomen is soft. There is mass.     Tenderness: There is no abdominal tenderness. There is no guarding or rebound.     Hernia: No hernia is present.  Comments: 4 cm ovoid subcutaneous mass with a small amount of purulent fluid expressed.  It is tender to touch.  Mild erythema is noted circumferentially.  It does extend down deep in the subcutaneous tissue.  Skin:    General: Skin is warm and dry.  Neurological:     Mental Status: She is alert and oriented to person, place, and time.   ER notes reviewed Assessment  Infected chronic sebaceous cyst, abdominal wall Plan  I do not have the capability of excising the cyst in my office, thus will take the patient to the operating room on 06/29/2021 for excision of the sebaceous cyst of the abdominal wall.  The risks and benefits of the procedure including bleeding and infection were fully explained to the patient, who gave informed consent.

## 2021-06-23 NOTE — Patient Instructions (Signed)
Katie Moore  06/23/2021     @PREFPERIOPPHARMACY @   Your procedure is scheduled on 06/29/2021.  Report to [facility] at 11:05 A.M.  Call this number if you have problems the morning of surgery:  [phone]   Remember:  Do not eat or drink after midnight.     Take these medicines the morning of surgery with A SIP OF WATER : Pepcid     Do not wear jewelry, make-up or nail polish.  Do not wear lotions, powders, or perfumes, or deodorant.  Do not shave 48 hours prior to surgery.  Men may shave face and neck.  Do not bring valuables to the hospital.  [facility] is not responsible for any belongings or valuables.  Contacts, dentures or bridgework may not be worn into surgery.  Leave your suitcase in the car.  After surgery it may be brought to your room.  For patients admitted to the hospital, discharge time will be determined by your treatment team.  Patients discharged the day of surgery will not be allowed to drive home.   Name and phone number of your driver:   family Special instructions:  n/a  Please read over the following fact sheets that you were given. Care and Recovery After Surgery  General Anesthesia, Adult General anesthesia is the use of medicines to make a person "go to sleep" (unconscious) for a medical procedure. General anesthesia must be used for certain procedures, and is often recommended for procedures that: Last a long time. Require you to be still or in an unusual position. Are major and can cause blood loss. The medicines used for general anesthesia are called general anesthetics. As well as making you unconscious for a certain amount of time, these medicines: Prevent pain. Control your blood pressure. Relax your muscles. Tell a health care provider about: Any allergies you have. All medicines you are taking, including vitamins, herbs, eye drops, creams, and over-the-counter medicines. Any problems you or family members have had with anesthetic  medicines. Types of anesthetics you have had in the past. Any blood disorders you have. Any surgeries you have had. Any medical conditions you have. Any recent upper respiratory, chest, or ear infections. Any history of: Heart or lung conditions, such as heart failure, sleep apnea, asthma, or chronic obstructive pulmonary disease (COPD). Armed forces logistics/support/administrative officer. Depression or anxiety. Any tobacco or drug use, including marijuana or alcohol use. Whether you are pregnant or may be pregnant. What are the risks? Generally, this is a safe procedure. However, problems may occur, including: Allergic reaction. Lung and heart problems. Inhaling food or liquid from the stomach into the lungs (aspiration). Nerve injury. Dental injury. Air in the bloodstream, which can lead to stroke. Extreme agitation or confusion (delirium) when you wake up from the anesthetic. Waking up during your procedure and being unable to move. This is rare. These problems are more likely to develop if you are having a major surgery or if you have an advanced or serious medical condition. You can prevent some of these complications by answering all of your health care provider's questionsthoroughly and by following all instructions before your procedure. General anesthesia can cause side effects, including: Nausea or vomiting. A sore throat from the breathing tube. Hoarseness. Wheezing or coughing. Shaking chills. Tiredness. Body aches. Anxiety. Sleepiness or drowsiness. Confusion or agitation. What happens before the procedure? Staying hydrated Follow instructions from your health care provider about hydration, which may include: Up to 2 hours before the procedure -  you may continue to drink clear liquids, such as water, clear fruit juice, black coffee, and plain tea.  Eating and drinking restrictions Follow instructions from your health care provider about eating and drinking, which may include: 8 hours before the  procedure - stop eating heavy meals or foods such as meat, fried foods, or fatty foods. 6 hours before the procedure - stop eating light meals or foods, such as toast or cereal. 6 hours before the procedure - stop drinking milk or drinks that contain milk. 2 hours before the procedure - stop drinking clear liquids. Medicines Ask your health care provider about: Changing or stopping your regular medicines. This is especially important if you are taking diabetes medicines or blood thinners. Taking medicines such as aspirin and ibuprofen. These medicines can thin your blood. Do not take these medicines unless your health care provider tells you to take them. Taking over-the-counter medicines, vitamins, herbs, and supplements. Do not take these during the week before your procedure unless your health care provider approves them. General instructions Starting 3-6 weeks before the procedure, do not use any products that contain nicotine or tobacco, such as cigarettes and e-cigarettes. If you need help quitting, ask your health care provider. If you brush your teeth on the morning of the procedure, make sure to spit out all of the toothpaste. Tell your health care provider if you become ill or develop a cold, cough, or fever. If instructed by your health care provider, bring your sleep apnea device with you on the day of your surgery (if applicable). Ask your health care provider if you will be going home the same day, the following day, or after a longer hospital stay. Plan to have a responsible adult take you home from the hospital or clinic. Plan to have a responsible adult care for you for the time you are told after you leave the hospital or clinic. This is important. What happens during the procedure?  You will be given anesthetics through both of the following: A mask placed over your nose and mouth. An IV in one of your veins. You may receive a medicine to help you relax (sedative). After you  are unconscious, a breathing tube may be inserted down your throat to help you breathe. This will be removed before you wake up. An anesthesia specialist will stay with you throughout your procedure. He or she will: Keep you comfortable and safe by continuing to give you medicines and adjusting the amount of medicine that you get. Monitor your blood pressure, pulse, and oxygen levels to make sure that the anesthetics do not cause any problems. The procedure may vary among health care providers and hospitals. What happens after the procedure? Your blood pressure, temperature, heart rate, breathing rate, and blood oxygen level will be monitored until the medicines you were given have worn off. You will wake up in a recovery area. You may wake up slowly. If you feel anxious or agitated, you may be given medicine to help you calm down. If you will be going home the same day, your health care provider may check to make sure you can walk, drink, and urinate. Your health care provider will treat any pain or side effects you have before you go home. Do not drive or operate machinery until your health care provider says that it is safe. Summary General anesthesia is used to keep you still and prevent pain during a procedure. It is important to tell your health care provider about  your medical history and any surgeries you have had, and previous experience with anesthesia. Follow your health care provider's instructions about when to stop eating, drinking, or taking certain medicines before your procedure. Plan to have a responsible adult take you home from the hospital or clinic. This information is not intended to replace advice given to you by your health care provider. Make sure you discuss any questions you have with your healthcare provider. Document Revised: 08/23/2020 Document Reviewed: 03/24/2020 Elsevier Patient Education  2022 [place].   Epidermoid Cyst Removal Epidermoid cyst removal  is a procedure to remove a fluid-filled sac that forms under your skin (epidermoid cyst). This type of cyst is filled with a thick, oily substance (keratin) that is secreted by your skin glands. Epidermoid cysts may also be called epidermal cysts, or keratin cysts. Normally, the skin secretes this pasty material through a gland or a hair follicle. However, when a skin gland or hairfollicle becomes blocked, an epidermoid cyst can form. You may need this procedure if you have an epidermal cyst that becomes large,uncomfortable, or inflamed. Tell a health care provider about: Any allergies you have. All medicines you are taking, including vitamins, herbs, eye drops, creams, and over-the-counter medicines. Any problems you or family members have had with anesthetic medicines. Any blood disorders you have. Any surgeries you have had. Any medical conditions you have now or have had. Whether you are pregnant or may be pregnant. What are the risks? Generally, this is a safe procedure. However, problems may occur, including: Recurrence of the cyst. Bleeding. Infection. Scarring. What happens before the procedure? Ask your health care provider about: Changing or stopping your regular medicines. This is especially important if you are taking diabetes medicines or blood thinners. Taking medicines such as aspirin and ibuprofen. These medicines can thin your blood. Do not take these medicines unless your health care provider tells you to take them. Taking over-the-counter medicines, vitamins, herbs, and supplements. If you have an inflamed or infected cyst, you may have to take antibiotic medicine before the cyst removal. Take your antibiotic as told by your health care provider. Do not stop taking the antibiotic even if you start to feel better. Take a shower on the morning of your procedure. Your health care provider may ask you to use a germ-killing soap. What happens during the procedure?  You will be  given a medicine to numb the area (local anesthetic). The skin around the cyst will be cleaned with a germ-killing solution. The health care provider will make a small incision in your skin over the cyst. The health care provider will separate the cyst from the surrounding tissues that are under your skin. If possible, the cyst will be removed undamaged (intact). If the cyst bursts (ruptures), it will be removed in pieces. After the cyst is removed, the health care provider will control any bleeding and close the incision with small stitches (sutures). Small incisions may not need sutures, and the bleeding will be controlled by applying direct pressure with gauze. The health care provider may apply antibiotic ointment and a bandage (dressing) over the incision. The procedure may vary among health care providers and hospitals. What happens after the procedure? If you are prescribed an antibiotic medicine or ointment, take or apply it as told by your health care provider. Do not stop using the antibiotic even if you start to feel better. Summary Epidermoid cyst removal is a procedure to remove a sac that has formed under your skin.  You may need this procedure if you have an epidermoid cyst that becomes large, uncomfortable, or inflamed. The health care provider will make a small incision in your skin to remove the cyst. If you are prescribed an antibiotic medicine before the procedure, after the procedure, or both, use the antibiotic as told by your health care provider. Do not stop using the antibiotic even if you start to feel better. This information is not intended to replace advice given to you by your health care provider. Make sure you discuss any questions you have with your healthcare provider. Document Revised: 03/17/2020 Document Reviewed: 03/17/2020 Elsevier Patient Education  [place].

## 2021-06-24 ENCOUNTER — Encounter (HOSPITAL_COMMUNITY): Payer: Self-pay

## 2021-06-24 ENCOUNTER — Other Ambulatory Visit: Payer: Self-pay

## 2021-06-24 ENCOUNTER — Encounter (HOSPITAL_COMMUNITY)
Admission: RE | Admit: 2021-06-24 | Discharge: 2021-06-24 | Disposition: A | Discharge status: Home / Self Care | Payer: Commercial Managed Care - PPO | Source: Ambulatory Visit | Attending: General Surgery | Admitting: General Surgery

## 2021-06-24 DIAGNOSIS — Z01818 Encounter for other preprocedural examination: Secondary | ICD-10-CM | POA: Diagnosis present

## 2021-06-24 HISTORY — DX: Idiopathic sleep related nonobstructive alveolar hypoventilation: G47.34

## 2021-06-24 LAB — PREGNANCY, URINE: Preg Test, Ur: NEGATIVE

## 2021-06-29 ENCOUNTER — Ambulatory Visit (HOSPITAL_COMMUNITY): Payer: Commercial Managed Care - PPO | Admitting: Anesthesiology

## 2021-06-29 ENCOUNTER — Encounter (HOSPITAL_COMMUNITY)
Admission: RE | Disposition: A | Discharge status: Home / Self Care | Payer: Self-pay | Source: Home / Self Care | Attending: General Surgery

## 2021-06-29 ENCOUNTER — Encounter (HOSPITAL_COMMUNITY): Payer: Self-pay | Admitting: General Surgery

## 2021-06-29 ENCOUNTER — Ambulatory Visit (HOSPITAL_COMMUNITY)
Admission: RE | Admit: 2021-06-29 | Discharge: 2021-06-29 | Disposition: A | Discharge status: Home / Self Care | Payer: Commercial Managed Care - PPO | Attending: General Surgery | Admitting: General Surgery

## 2021-06-29 DIAGNOSIS — L720 Epidermal cyst: Secondary | ICD-10-CM | POA: Diagnosis not present

## 2021-06-29 DIAGNOSIS — Z79899 Other long term (current) drug therapy: Secondary | ICD-10-CM | POA: Insufficient documentation

## 2021-06-29 DIAGNOSIS — Z87891 Personal history of nicotine dependence: Secondary | ICD-10-CM | POA: Insufficient documentation

## 2021-06-29 DIAGNOSIS — Z880 Allergy status to penicillin: Secondary | ICD-10-CM | POA: Diagnosis not present

## 2021-06-29 DIAGNOSIS — L723 Sebaceous cyst: Secondary | ICD-10-CM

## 2021-06-29 DIAGNOSIS — Z881 Allergy status to other antibiotic agents status: Secondary | ICD-10-CM | POA: Diagnosis not present

## 2021-06-29 HISTORY — PX: MASS EXCISION: SHX2000

## 2021-06-29 SURGERY — EXCISION MASS
Anesthesia: General | Site: Abdomen

## 2021-06-29 MED ORDER — LIDOCAINE HCL (CARDIAC) PF 100 MG/5ML IV SOSY
PREFILLED_SYRINGE | INTRAVENOUS | Status: DC | PRN
  Administered 2021-06-29: 100 mg via INTRAVENOUS

## 2021-06-29 MED ORDER — LIDOCAINE HCL (PF) 2 % IJ SOLN
INTRAMUSCULAR | Status: AC
  Filled 2021-06-29: qty 5

## 2021-06-29 MED ORDER — VANCOMYCIN HCL IN DEXTROSE 1-5 GM/200ML-% IV SOLN
1000.0000 mg | INTRAVENOUS | Status: AC
  Administered 2021-06-29: 1000 mg via INTRAVENOUS

## 2021-06-29 MED ORDER — FENTANYL CITRATE (PF) 100 MCG/2ML IJ SOLN
INTRAMUSCULAR | Status: DC | PRN
  Administered 2021-06-29 (×2): 50 ug via INTRAVENOUS

## 2021-06-29 MED ORDER — ONDANSETRON HCL 4 MG/2ML IJ SOLN
INTRAMUSCULAR | Status: AC
  Filled 2021-06-29: qty 2

## 2021-06-29 MED ORDER — PROPOFOL 10 MG/ML IV BOLUS
INTRAVENOUS | Status: DC | PRN
  Administered 2021-06-29: 300 mg via INTRAVENOUS

## 2021-06-29 MED ORDER — DEXAMETHASONE SODIUM PHOSPHATE 10 MG/ML IJ SOLN
INTRAMUSCULAR | Status: AC
  Filled 2021-06-29: qty 1

## 2021-06-29 MED ORDER — BUPIVACAINE HCL (PF) 0.5 % IJ SOLN: INTRAMUSCULAR | Status: DC | PRN

## 2021-06-29 MED ORDER — HYDROCODONE-ACETAMINOPHEN 5-325 MG PO TABS: 1.0000 | ORAL_TABLET | ORAL | 0 refills | Status: DC | PRN

## 2021-06-29 MED ORDER — DEXAMETHASONE SODIUM PHOSPHATE 4 MG/ML IJ SOLN
INTRAMUSCULAR | Status: DC | PRN
  Administered 2021-06-29: 4 mg via INTRAVENOUS

## 2021-06-29 MED ORDER — MIDAZOLAM HCL 2 MG/2ML IJ SOLN
INTRAMUSCULAR | Status: AC
  Filled 2021-06-29: qty 2

## 2021-06-29 MED ORDER — FENTANYL CITRATE (PF) 100 MCG/2ML IJ SOLN
INTRAMUSCULAR | Status: AC
  Filled 2021-06-29: qty 2

## 2021-06-29 MED ORDER — KETOROLAC TROMETHAMINE 30 MG/ML IJ SOLN
30.0000 mg | Freq: Once | INTRAMUSCULAR | Status: AC
  Administered 2021-06-29: 30 mg via INTRAVENOUS
  Filled 2021-06-29: qty 1

## 2021-06-29 MED ORDER — CHLORHEXIDINE GLUCONATE CLOTH 2 % EX PADS: 6.0000 | MEDICATED_PAD | Freq: Once | CUTANEOUS | Status: DC

## 2021-06-29 MED ORDER — CHLORHEXIDINE GLUCONATE 0.12 % MT SOLN
15.0000 mL | Freq: Once | OROMUCOSAL | Status: AC
  Administered 2021-06-29: 15 mL via OROMUCOSAL

## 2021-06-29 MED ORDER — PROPOFOL 10 MG/ML IV BOLUS
INTRAVENOUS | Status: AC
  Filled 2021-06-29: qty 40

## 2021-06-29 MED ORDER — MIDAZOLAM HCL 5 MG/5ML IJ SOLN
INTRAMUSCULAR | Status: DC | PRN
  Administered 2021-06-29: 2 mg via INTRAVENOUS

## 2021-06-29 MED ORDER — ROCURONIUM BROMIDE 10 MG/ML (PF) SYRINGE
PREFILLED_SYRINGE | INTRAVENOUS | Status: AC
  Filled 2021-06-29: qty 10

## 2021-06-29 MED ORDER — ORAL CARE MOUTH RINSE: 15.0000 mL | Freq: Once | OROMUCOSAL | Status: AC

## 2021-06-29 MED ORDER — ONDANSETRON HCL 4 MG/2ML IJ SOLN: 4.0000 mg | Freq: Once | INTRAMUSCULAR | Status: DC | PRN

## 2021-06-29 MED ORDER — BUPIVACAINE LIPOSOME 1.3 % IJ SUSP
INTRAMUSCULAR | Status: DC | PRN
  Administered 2021-06-29: 20 mL

## 2021-06-29 MED ORDER — BUPIVACAINE LIPOSOME 1.3 % IJ SUSP
INTRAMUSCULAR | Status: AC
  Filled 2021-06-29: qty 20

## 2021-06-29 MED ORDER — LACTATED RINGERS IV SOLN: INTRAVENOUS | Status: DC

## 2021-06-29 MED ORDER — 0.9 % SODIUM CHLORIDE (POUR BTL) OPTIME
TOPICAL | Status: DC | PRN
  Administered 2021-06-29: 1000 mL

## 2021-06-29 MED ORDER — MEPERIDINE HCL 50 MG/ML IJ SOLN: 6.2500 mg | INTRAMUSCULAR | Status: DC | PRN

## 2021-06-29 MED ORDER — ONDANSETRON HCL 4 MG/2ML IJ SOLN
INTRAMUSCULAR | Status: DC | PRN
  Administered 2021-06-29: 4 mg via INTRAVENOUS

## 2021-06-29 MED ORDER — FENTANYL CITRATE (PF) 100 MCG/2ML IJ SOLN
25.0000 ug | INTRAMUSCULAR | Status: DC | PRN
  Administered 2021-06-29: 50 ug via INTRAVENOUS
  Filled 2021-06-29: qty 2

## 2021-06-29 SURGICAL SUPPLY — 37 items
ADH SKN CLS APL DERMABOND .7 (GAUZE/BANDAGES/DRESSINGS) ×1
APL PRP STRL LF ISPRP CHG 10.5 (MISCELLANEOUS) ×1
APPLICATOR CHLORAPREP 10.5 ORG (MISCELLANEOUS) ×2 IMPLANT
BLADE SURG SZ11 CARB STEEL (BLADE) ×1 IMPLANT
CLOTH BEACON ORANGE TIMEOUT ST (SAFETY) ×2 IMPLANT
COVER LIGHT HANDLE STERIS (MISCELLANEOUS) ×4 IMPLANT
DERMABOND ADVANCED (GAUZE/BANDAGES/DRESSINGS) ×1
DERMABOND ADVANCED .7 DNX12 (GAUZE/BANDAGES/DRESSINGS) IMPLANT
DRAPE EENT ADH APERT 31X51 STR (DRAPES) IMPLANT
ELECT NDL TIP 2.8 STRL (NEEDLE) IMPLANT
ELECT NEEDLE TIP 2.8 STRL (NEEDLE) IMPLANT
ELECT REM PT RETURN 9FT ADLT (ELECTROSURGICAL) ×2
ELECTRODE REM PT RTRN 9FT ADLT (ELECTROSURGICAL) ×1 IMPLANT
GAUZE 4X4 16PLY ~~LOC~~+RFID DBL (SPONGE) ×1 IMPLANT
GLOVE SURG SS PI 7.5 STRL IVOR (GLOVE) ×2 IMPLANT
GLOVE SURG UNDER POLY LF SZ7 (GLOVE) ×4 IMPLANT
GOWN STRL REUS W/TWL LRG LVL3 (GOWN DISPOSABLE) ×4 IMPLANT
KIT TURNOVER KIT A (KITS) ×2 IMPLANT
MANIFOLD NEPTUNE II (INSTRUMENTS) ×2 IMPLANT
NDL HYPO 18GX1.5 BLUNT FILL (NEEDLE) IMPLANT
NDL HYPO 21X1.5 SAFETY (NEEDLE) IMPLANT
NDL HYPO 25X1 1.5 SAFETY (NEEDLE) ×1 IMPLANT
NEEDLE HYPO 18GX1.5 BLUNT FILL (NEEDLE) ×2 IMPLANT
NEEDLE HYPO 21X1.5 SAFETY (NEEDLE) ×2 IMPLANT
NEEDLE HYPO 25X1 1.5 SAFETY (NEEDLE) ×2 IMPLANT
NS IRRIG 1000ML POUR BTL (IV SOLUTION) ×2 IMPLANT
PACK MINOR (CUSTOM PROCEDURE TRAY) ×2 IMPLANT
PAD ARMBOARD 7.5X6 YLW CONV (MISCELLANEOUS) ×2 IMPLANT
SET BASIN LINEN APH (SET/KITS/TRAYS/PACK) ×2 IMPLANT
SUT ETHILON 3 0 FSL (SUTURE) IMPLANT
SUT MNCRL AB 4-0 PS2 18 (SUTURE) ×1 IMPLANT
SUT PROLENE 3 0 PS 1 (SUTURE) IMPLANT
SUT PROLENE 4 0 PS 2 18 (SUTURE) IMPLANT
SUT VIC AB 3-0 SH 27 (SUTURE) ×2
SUT VIC AB 3-0 SH 27X BRD (SUTURE) IMPLANT
SYR 20ML LL LF (SYRINGE) ×2 IMPLANT
SYR CONTROL 10ML LL (SYRINGE) ×2 IMPLANT

## 2021-06-29 NOTE — Anesthesia Procedure Notes (Signed)
Procedure Name: LMA Insertion Date/Time: 06/29/2021 1:04 PM Performed by: Tacy Learn, CRNA Pre-anesthesia Checklist: Patient identified, Emergency Drugs available, Suction available, Patient being monitored and Timeout performed Patient Re-evaluated:Patient Re-evaluated prior to induction Oxygen Delivery Method: Circle system utilized Preoxygenation: Pre-oxygenation with 100% oxygen Induction Type: IV induction LMA: LMA inserted LMA Size: 4.0 Number of attempts: 1 Placement Confirmation: positive ETCO2, CO2 detector and breath sounds checked- equal and bilateral Tube secured with: Tape Dental Injury: Teeth and Oropharynx as per pre-operative assessment

## 2021-06-29 NOTE — Anesthesia Preprocedure Evaluation (Signed)
Anesthesia Evaluation  Patient identified by MRN, date of birth, ID band Patient awake    Reviewed: Allergy & Precautions, NPO status , Patient's Chart, lab work & pertinent test results  History of Anesthesia Complications Negative for: history of anesthetic complications  Airway Mallampati: II  TM Distance: >3 FB Neck ROM: Full    Dental  (+) Dental Advisory Given Crowns, fillings:   Pulmonary sleep apnea (sleep related hypoxia) , former smoker,    Pulmonary exam normal breath sounds clear to auscultation       Cardiovascular Exercise Tolerance: Good hypertension, Pt. on medications Normal cardiovascular exam Rhythm:Regular Rate:Normal     Neuro/Psych negative neurological ROS  negative psych ROS   GI/Hepatic Neg liver ROS, GERD  Medicated and Controlled,  Endo/Other  negative endocrine ROS  Renal/GU negative Renal ROS     Musculoskeletal negative musculoskeletal ROS (+)   Abdominal   Peds  Hematology negative hematology ROS (+)   Anesthesia Other Findings   Reproductive/Obstetrics negative OB ROS                            Anesthesia Physical Anesthesia Plan  ASA: 3  Anesthesia Plan: General   Post-op Pain Management:    Induction: Intravenous  PONV Risk Score and Plan: Ondansetron, Dexamethasone and Midazolam  Airway Management Planned: LMA  Additional Equipment:   Intra-op Plan:   Post-operative Plan: Extubation in OR  Informed Consent: I have reviewed the patients History and Physical, chart, labs and discussed the procedure including the risks, benefits and alternatives for the proposed anesthesia with the patient or authorized representative who has indicated his/her understanding and acceptance.     Dental advisory given  Plan Discussed with: CRNA and Surgeon  Anesthesia Plan Comments:        Anesthesia Quick Evaluation

## 2021-06-29 NOTE — Anesthesia Postprocedure Evaluation (Signed)
Anesthesia Post Note  Patient: Katie Moore  Procedure(s) Performed: EXCISION CYST ABDOMINAL WALL (Abdomen)  Patient location during evaluation: PACU Anesthesia Type: General Level of consciousness: awake and alert and oriented Pain management: pain level controlled Vital Signs Assessment: post-procedure vital signs reviewed and stable Respiratory status: spontaneous breathing and respiratory function stable Cardiovascular status: blood pressure returned to baseline and stable Postop Assessment: no apparent nausea or vomiting Anesthetic complications: no   No notable events documented.   Last Vitals:  Vitals:   06/29/21 1430 06/29/21 1449  BP: 124/71 136/76  Pulse: 70 69  Resp: (!) 8 16  Temp:  36.8 C  SpO2: 93% 94%    Last Pain:  Vitals:   06/29/21 1449  TempSrc: Oral  PainSc: 3                  Ladaisha Portillo C Teniyah Seivert

## 2021-06-29 NOTE — Transfer of Care (Signed)
Immediate Anesthesia Transfer of Care Note  Patient: Katie Moore  Procedure(s) Performed: EXCISION CYST ABDOMINAL WALL (Abdomen)  Patient Location: PACU  Anesthesia Type:General  Level of Consciousness: awake, alert , oriented and patient cooperative  Airway & Oxygen Therapy: Patient Spontanous Breathing and Patient connected to nasal cannula oxygen  Post-op Assessment: Report given to RN, Post -op Vital signs reviewed and stable and Patient moving all extremities  Post vital signs: Reviewed and stable  Last Vitals:  Vitals Value Taken Time  BP    Temp    Pulse 90 06/29/21 1341  Resp 23 06/29/21 1341  SpO2 97 % 06/29/21 1341  Vitals shown include unvalidated device data.  Last Pain:  Vitals:   06/29/21 1134  TempSrc: Oral  PainSc: 0-No pain      Patients Stated Pain Goal: 5 (25/49/82 6415)  Complications: No notable events documented.

## 2021-06-29 NOTE — Op Note (Signed)
Patient:  Katie Moore  DOB:  [birth year]  MRN:  [id number]   Preop Diagnosis: Sebaceous cyst and granuloma, abdominal wall  Postop Diagnosis: Same  Procedure:  Excision of sebaceous cyst/granuloma, abdominal wall  Surgeon: Aviva Signs, MD  Anes: General  Indications: Patient is a 50 year old white female who presents with a chronically draining cystic lesion in the subcutaneous tissue in the right lower quadrant of the abdominal wall.  She has failed conservative therapy.  The risks and benefits of the procedure were fully explained to the patient, who gave informed consent.  Procedure note: The patient was placed in the supine position.  After general anesthesia was administered, the abdomen was prepped and draped using usual sterile technique with ChloraPrep.  Surgical site confirmation was performed.  The patient had an open wound along the lateral aspect of the granulomatous tissue.  It appeared to be bilobed in nature.  Elliptical incision was made around this area to normal tissue.  Total diameter was approximately 5 cm.  This was taken down to the subcutaneous tissue.  All of this was removed and sent to pathology further examination.  This did not go to the abdominal wall.  A bleeding was controlled using Bovie electrocautery.  The subcutaneous layer was reapproximated using 3-0 Vicryl interrupted sutures.  Exparel was instilled into the surrounding wound.  The skin was closed using a 4-0 Monocryl subcuticular suture.  Dermabond was applied.  All tape and needle counts were correct at the end of the procedure.  The patient was awakened and transferred to PACU in stable condition.    Complications: None  EBL: Minimal  Specimen: Granulomatous tissue, abdominal wall

## 2021-06-29 NOTE — Interval H&P Note (Signed)
History and Physical Interval Note:  06/29/2021 12:29 PM  Katie Moore  has presented today for surgery, with the diagnosis of Sebaceous cyst.  The various methods of treatment have been discussed with the patient and family. After consideration of risks, benefits and other options for treatment, the patient has consented to  Procedure(s): EXCISION CYST; ABDOMINAL WALL (N/A) as a surgical intervention.  The patient's history has been reviewed, patient examined, no change in status, stable for surgery.  I have reviewed the patient's chart and labs.  Questions were answered to the patient's satisfaction.     Aviva Signs

## 2021-06-30 ENCOUNTER — Encounter (HOSPITAL_COMMUNITY): Payer: Self-pay | Admitting: General Surgery

## 2021-07-01 LAB — SURGICAL PATHOLOGY

## 2021-07-11 ENCOUNTER — Encounter: Payer: Self-pay | Admitting: Gastroenterology

## 2021-07-11 ENCOUNTER — Ambulatory Visit (INDEPENDENT_AMBULATORY_CARE_PROVIDER_SITE_OTHER): Payer: Commercial Managed Care - PPO | Admitting: Gastroenterology

## 2021-07-11 VITALS — BP 120/64 | HR 80 | Ht 65.35 in | Wt 262.4 lb

## 2021-07-11 DIAGNOSIS — K529 Noninfective gastroenteritis and colitis, unspecified: Secondary | ICD-10-CM | POA: Diagnosis not present

## 2021-07-11 DIAGNOSIS — E739 Lactose intolerance, unspecified: Secondary | ICD-10-CM

## 2021-07-11 NOTE — Progress Notes (Signed)
[facility] Progress Note  Chief Complaint: IBS with diarrhea  Subjective  History: Initially seen in clinic June 2021, several years of bloating with intermittent diarrhea and anal fissure seen by Dr. Earlean Shawl previously, who had also done a colonoscopy (no biopsies, see my office note for details).  Celiac labs negative, Levsin not much help.  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 Seen in ED June 28 for abdominal wall abscess, treated with doxycycline and referred to general surgery.  Sebaceous cyst removed by Dr. Arnoldo Morale on July 6.  Katie Moore was glad to report that her diarrhea has significantly improved since I last saw her.  Sometimes milk or dairy products seem to be the cause, but she also got much better laminating eggs from her diet.  Her bowel habits are not regular and she has had no recurrence of pain or fissure.  Healing well after the recent surgery.  ROS: Cardiovascular:  no chest pain Respiratory: no dyspnea  The patient's Past Medical, Family and Social History were reviewed and are on file in the EMR. Past Surgical History:  Procedure Laterality Date   CESAREAN SECTION     x 2   COLONOSCOPY     MASS EXCISION N/A 06/29/2021   Procedure: EXCISION CYST ABDOMINAL WALL;  Surgeon: Aviva Signs, MD;  Location: [facility];  Service: General;  Laterality: N/A;   TUBAL LIGATION  2009    Objective:  Med list reviewed  Current Outpatient Medications:    cholecalciferol (VITAMIN D3) 25 MCG (1000 UNIT) tablet, Take 1,000 Units by mouth daily., Disp: , Rfl:    famotidine (PEPCID) 20 MG tablet, Take 20 mg by mouth 2 (two) times daily., Disp: , Rfl:    hydrochlorothiazide (MICROZIDE) 12.5 MG capsule, Take 12.5 mg by mouth daily., Disp: , Rfl:    hyoscyamine (LEVSIN) 0.125 MG tablet, Take by mouth as needed., Disp: , Rfl:    lisinopril (ZESTRIL) 10 MG tablet,  Take 10 mg by mouth daily., Disp: , Rfl:    Multiple Vitamins-Minerals (MULTIVITAMIN WITH MINERALS) tablet, Take 1 tablet by mouth daily., Disp: , Rfl:    nitroGLYCERIN (NITROGLYN) 2 % ointment, Place onto the skin as needed., Disp: , Rfl:    tetrahydrozoline-zinc (VISINE-AC) 0.05-0.25 % ophthalmic solution, Place 2 drops into both eyes daily as needed (dry eyes)., Disp: , Rfl:    vitamin B-12 (CYANOCOBALAMIN) 1000 MCG tablet, Take 1,000 mcg by mouth daily., Disp: , Rfl:    HYDROcodone-acetaminophen (NORCO) 5-325 MG tablet, Take 1 tablet by mouth every 4 (four) hours as needed for moderate pain. (Patient not taking: Reported on 07/11/2021), Disp: 30 tablet, Rfl: 0   Vital signs in last 24 hrs: Vitals:   07/11/21 1402  BP: 120/64  Pulse: 80   Wt Readings from Last 3 Encounters:  07/11/21 262 lb 6 oz (119 kg)  06/24/21 258 lb 12.8 oz (117.4 kg)  06/23/21 258 lb 12.8 oz (117.4 kg)    Physical Exam  Well-appearing HEENT: sclera anicteric, oral mucosa moist without lesions Neck: supple, no thyromegaly, JVD or lymphadenopathy Cardiac: RRR without murmurs, S1S2 heard, no peripheral edema Pulm: clear to auscultation bilaterally, normal RR and effort noted Abdomen: soft, no tenderness, with active bowel sounds. No guarding or palpable hepatosplenomegaly.  Right mid abdominal horizontal incision healing well.  Nontender Skin; warm and dry, no jaundice or rash  Labs:   ___________________________________________ Radiologic studies:  ____________________________________________ Other:   _____________________________________________ Assessment & Plan  Assessment: Encounter Diagnoses  Name Primary?   Chronic diarrhea Yes   Lactose intolerance   History of anal fissure  Chronic diarrhea, probably mild IBS, some lactose intolerance and other maldigestion/food triggers.  Much improved since last visit, no longer needs Levsin.  Plan: Recall colon 11/2027 based on records from Dr.  Earlean Shawl. See me as needed  20 minutes were spent on this encounter (including chart review, history/exam, counseling/coordination of care, and documentation) > 50% of that time was spent on counseling and coordination of care.  Topics discussed included: See above.  Nelida Meuse III

## 2021-07-11 NOTE — Patient Instructions (Signed)
You will be due for a recall colonoscopy in 11/2027. We will send you a reminder in the mail when it gets closer to that time.  If you are age 50 or younger, your body mass index should be between 19-25. Your Body mass index is 43.19 kg/m. If this is out of the aformentioned range listed, please consider follow up with your Primary Care Provider.   __________________________________________________________  The [facility] providers would like to encourage you to use [facility] to communicate with providers for non-urgent requests or questions.  Due to long hold times on the telephone, sending your provider a message by [facility] may be a faster and more efficient way to get a response.  Please allow 48 business hours for a response.  Please remember that this is for non-urgent requests.   Due to recent changes in healthcare laws, you may see the results of your imaging and laboratory studies on MyChart before your provider has had a chance to review them.  We understand that in some cases there may be results that are confusing or concerning to you. Not all laboratory results come back in the same time frame and the provider may be waiting for multiple results in order to interpret others.  Please give [place] 48 hours in order for your provider to thoroughly review all the results before contacting the office for clarification of your results.    It was a pleasure to see you today!  Thank you for trusting me with your gastrointestinal care!

## 2021-08-10 ENCOUNTER — Ambulatory Visit (INDEPENDENT_AMBULATORY_CARE_PROVIDER_SITE_OTHER): Payer: Commercial Managed Care - PPO | Admitting: Dermatology

## 2021-08-10 ENCOUNTER — Encounter: Payer: Self-pay | Admitting: Dermatology

## 2021-08-10 ENCOUNTER — Other Ambulatory Visit: Payer: Self-pay

## 2021-08-10 DIAGNOSIS — L720 Epidermal cyst: Secondary | ICD-10-CM

## 2021-08-10 DIAGNOSIS — D2362 Other benign neoplasm of skin of left upper limb, including shoulder: Secondary | ICD-10-CM

## 2021-08-10 DIAGNOSIS — C44712 Basal cell carcinoma of skin of right lower limb, including hip: Secondary | ICD-10-CM

## 2021-08-10 DIAGNOSIS — Z86018 Personal history of other benign neoplasm: Secondary | ICD-10-CM

## 2021-08-10 DIAGNOSIS — C4491 Basal cell carcinoma of skin, unspecified: Secondary | ICD-10-CM

## 2021-08-10 DIAGNOSIS — Z1283 Encounter for screening for malignant neoplasm of skin: Secondary | ICD-10-CM | POA: Diagnosis not present

## 2021-08-10 DIAGNOSIS — D239 Other benign neoplasm of skin, unspecified: Secondary | ICD-10-CM

## 2021-08-10 DIAGNOSIS — D485 Neoplasm of uncertain behavior of skin: Secondary | ICD-10-CM

## 2021-08-10 HISTORY — DX: Basal cell carcinoma of skin, unspecified: C44.91

## 2021-08-17 ENCOUNTER — Telehealth: Payer: Self-pay | Admitting: *Deleted

## 2021-08-17 NOTE — Telephone Encounter (Signed)
Left message for patient to call back for results.  

## 2021-08-17 NOTE — Telephone Encounter (Signed)
-----   Message from Lavonna Monarch, MD sent at 08/17/2021  5:09 AM EDT ----- Schedule surgery with Dr. Darene Lamer

## 2021-08-17 NOTE — Telephone Encounter (Signed)
Path to patient. Made surgery appointment with Dr.Tafeen.  

## 2021-08-21 ENCOUNTER — Encounter: Payer: Self-pay | Admitting: Dermatology

## 2021-08-21 NOTE — Progress Notes (Signed)
   Follow-Up Visit   Subjective  Katie Moore is a 50 y.o. female who presents for the following: Annual Exam (Tient has history of atypical moles no real concerns ).  General skin check, history of atypical nevi. Location:  Duration:  Quality:  Associated Signs/Symptoms: Modifying Factors:  Severity:  Timing: Context:   Objective  Well appearing patient in no apparent distress; mood and affect are within normal limits. Mid Back Full body skin exam.  No atypical pigmented lesions.  1 possible nonmelanoma skin cancer right shin will be biopsied.  Left Zygomatic Area Half millimeter hard white upper dermal papule  Left Upper Arm - Anterior 5 mm firm pink dermal papule; dermoscopy compatible.  Right Lower Leg - Anterior Waxy 9 mm pink papule, rule out BCC       A full examination was performed including scalp, head, eyes, ears, nose, lips, neck, chest, axillae, abdomen, back, buttocks, bilateral upper extremities, bilateral lower extremities, hands, feet, fingers, toes, fingernails, and toenails. All findings within normal limits unless otherwise noted below.  Areas beneath undergarments not fully examined.   Assessment & Plan    Screening for malignant neoplasm of skin Mid Back  Yearly skin exams.  Encouraged to self examine twice annually.  Continue ultraviolet protection.  Milia Left Zygomatic Area  Discussed technique for elective removal.  Dermatofibroma Left Upper Arm - Anterior  May leave if stable  Neoplasm of uncertain behavior of skin Right Lower Leg - Anterior  Skin / nail biopsy Type of biopsy: tangential   Informed consent: discussed and consent obtained   Timeout: patient name, date of birth, surgical site, and procedure verified   Anesthesia: the lesion was anesthetized in a standard fashion   Anesthetic:  1% lidocaine w/ epinephrine 1-100,000 local infiltration Instrument used: flexible razor blade   Hemostasis achieved with: ferric  subsulfate   Outcome: patient tolerated procedure well   Post-procedure details: wound care instructions given    Specimen 1 - Surgical pathology Differential Diagnosis: BCC  Check Margins: No      I, Lavonna Monarch, MD, have reviewed all documentation for this visit.  The documentation on 08/21/21 for the exam, diagnosis, procedures, and orders are all accurate and complete.

## 2021-10-06 ENCOUNTER — Ambulatory Visit (INDEPENDENT_AMBULATORY_CARE_PROVIDER_SITE_OTHER): Payer: Commercial Managed Care - PPO | Admitting: Dermatology

## 2021-10-06 ENCOUNTER — Other Ambulatory Visit: Payer: Self-pay

## 2021-10-06 ENCOUNTER — Encounter: Payer: Self-pay | Admitting: Dermatology

## 2021-10-06 DIAGNOSIS — C44712 Basal cell carcinoma of skin of right lower limb, including hip: Secondary | ICD-10-CM

## 2021-10-06 MED ORDER — MUPIROCIN 2 % EX OINT: 1.0000 "application " | TOPICAL_OINTMENT | Freq: Two times a day (BID) | CUTANEOUS | 1 refills | Status: DC

## 2021-10-06 NOTE — Patient Instructions (Signed)

## 2021-10-23 ENCOUNTER — Encounter: Payer: Self-pay | Admitting: Dermatology

## 2021-10-23 NOTE — Progress Notes (Signed)
   Follow-Up Visit   Subjective  Katie Moore is a 49 y.o. female who presents for the following: Procedure (Patient here today for treatment of BCC x 1 right lower leg - anterior ).  BCC leg Location:  Duration:  Quality:  Associated Signs/Symptoms: Modifying Factors:  Severity:  Timing: Context:   Objective  Well appearing patient in no apparent distress; mood and affect are within normal limits. Right Lower Leg - Anterior Biopsy site identified by nurse, patient, and me    A focused examination was performed including head, neck, arms, legs. Relevant physical exam findings are noted in the Assessment and Plan.   Assessment & Plan    Basal cell carcinoma of skin of right lower limb, including hip Right Lower Leg - Anterior  Destruction of lesion Complexity: simple   Destruction method: electrodesiccation and curettage   Informed consent: discussed and consent obtained   Timeout:  patient name, date of birth, surgical site, and procedure verified Anesthesia: the lesion was anesthetized in a standard fashion   Anesthetic:  1% lidocaine w/ epinephrine 1-100,000 local infiltration Curettage performed in three different directions: Yes   Curettage cycles:  3 Lesion length (cm):  1 Lesion width (cm):  1 Margin per side (cm):  0 Final wound size (cm):  1 Hemostasis achieved with:  ferric subsulfate Outcome: patient tolerated procedure well with no complications   Additional details:  Wound innoculated with 5 fluorouracil solution.  mupirocin ointment (BACTROBAN) 2 % Apply 1 application topically 2 (two) times daily.      I, Lavonna Monarch, MD, have reviewed all documentation for this visit.  The documentation on 10/23/21 for the exam, diagnosis, procedures, and orders are all accurate and complete.

## 2021-10-27 ENCOUNTER — Encounter: Payer: Self-pay | Admitting: Dermatology

## 2022-01-16 ENCOUNTER — Other Ambulatory Visit: Payer: Self-pay

## 2022-01-16 ENCOUNTER — Ambulatory Visit (INDEPENDENT_AMBULATORY_CARE_PROVIDER_SITE_OTHER): Payer: Commercial Managed Care - PPO | Admitting: Dermatology

## 2022-01-16 ENCOUNTER — Encounter: Payer: Self-pay | Admitting: Dermatology

## 2022-01-16 DIAGNOSIS — C44712 Basal cell carcinoma of skin of right lower limb, including hip: Secondary | ICD-10-CM

## 2022-01-16 DIAGNOSIS — D2362 Other benign neoplasm of skin of left upper limb, including shoulder: Secondary | ICD-10-CM | POA: Diagnosis not present

## 2022-01-16 DIAGNOSIS — D239 Other benign neoplasm of skin, unspecified: Secondary | ICD-10-CM

## 2022-01-16 DIAGNOSIS — Z85828 Personal history of other malignant neoplasm of skin: Secondary | ICD-10-CM | POA: Diagnosis not present

## 2022-02-04 ENCOUNTER — Encounter: Payer: Self-pay | Admitting: Dermatology

## 2022-02-04 NOTE — Progress Notes (Signed)
° °  Follow-Up Visit   Subjective  Katie Moore is a 51 y.o. female who presents for the following: Follow-up (Follow up for bcc on right lower limb. No new concerns. Personal history of bcc. ).  Recheck site of skin cancer leg plus several other bumps Location:  Duration:  Quality:  Associated Signs/Symptoms: Modifying Factors:  Severity:  Timing: Context:   Objective  Well appearing patient in no apparent distress; mood and affect are within normal limits. Left Upper Arm - Anterior, Right Lower Leg - Anterior Firm 4 mm pink dermal papules, compatible dermoscopy  Right Lower Leg - Anterior No sign residual skin cancer, moderate PIH.    All sun exposed areas plus back examined.   Assessment & Plan    Dermatofibroma (2) Left Upper Arm - Anterior; Right Lower Leg - Anterior  Leave if stable  Personal history of other malignant neoplasm of skin Right Lower Leg - Anterior  Recheck as needed change      I, Lavonna Monarch, MD, have reviewed all documentation for this visit.  The documentation on 02/04/22 for the exam, diagnosis, procedures, and orders are all accurate and complete.

## 2022-03-15 ENCOUNTER — Other Ambulatory Visit: Payer: Self-pay | Admitting: Adult Health Nurse Practitioner

## 2022-03-15 ENCOUNTER — Other Ambulatory Visit (HOSPITAL_COMMUNITY): Payer: Self-pay | Admitting: Adult Health Nurse Practitioner

## 2022-03-15 DIAGNOSIS — E041 Nontoxic single thyroid nodule: Secondary | ICD-10-CM

## 2022-03-22 ENCOUNTER — Ambulatory Visit (HOSPITAL_COMMUNITY)
Admission: RE | Admit: 2022-03-22 | Discharge: 2022-03-22 | Disposition: A | Discharge status: Home / Self Care | Payer: Commercial Managed Care - PPO | Source: Ambulatory Visit | Attending: Adult Health Nurse Practitioner | Admitting: Adult Health Nurse Practitioner

## 2022-03-22 DIAGNOSIS — E041 Nontoxic single thyroid nodule: Secondary | ICD-10-CM | POA: Diagnosis not present

## 2023-01-16 ENCOUNTER — Ambulatory Visit: Payer: Commercial Managed Care - PPO | Admitting: Dermatology

## 2023-03-03 IMAGING — US US THYROID
1 series · 13 of 25 positions shown · non-contrast
Comparison: Prior thyroid ultrasound 09/05/2016

CLINICAL DATA: Goiter.

EXAM:
THYROID ULTRASOUND
TECHNIQUE: Ultrasound examination of the thyroid gland and adjacent soft
tissues was performed.

[Series 1: us thyroid · 0.07mm/px · 13 of 42 slices shown]
[im 1/42]
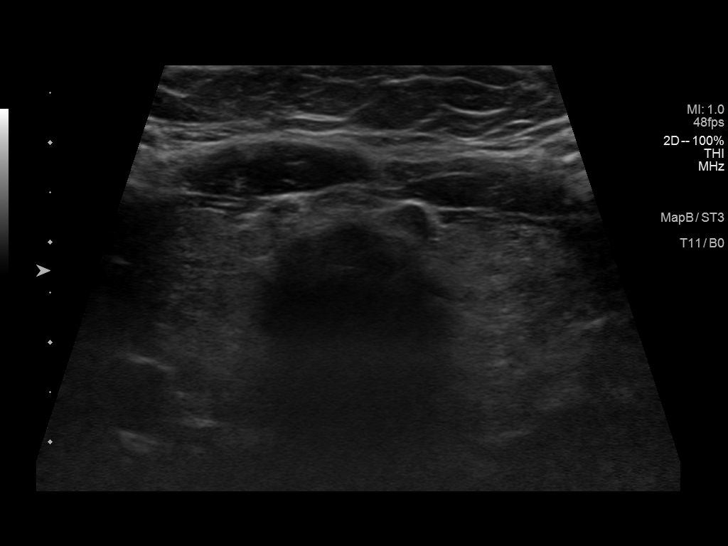
[im 4/42]
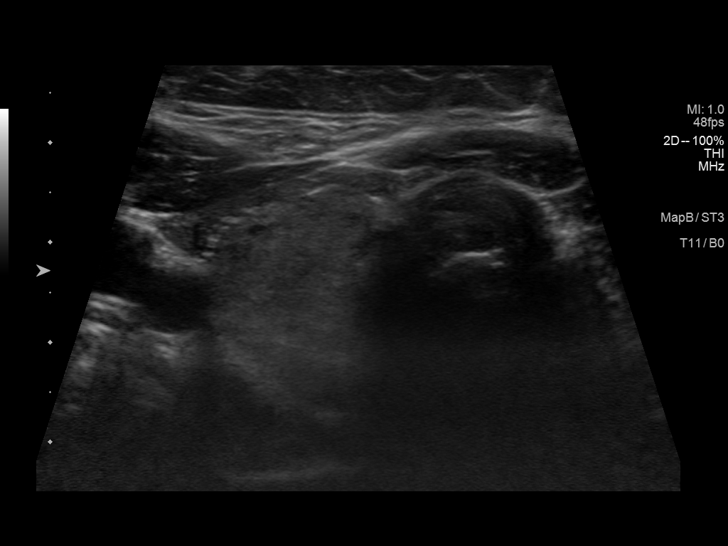
[im 7/42]
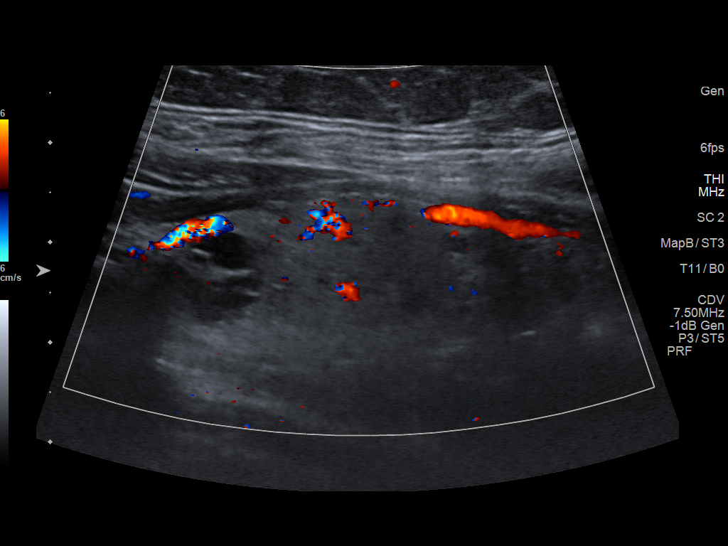
[im 11/42]
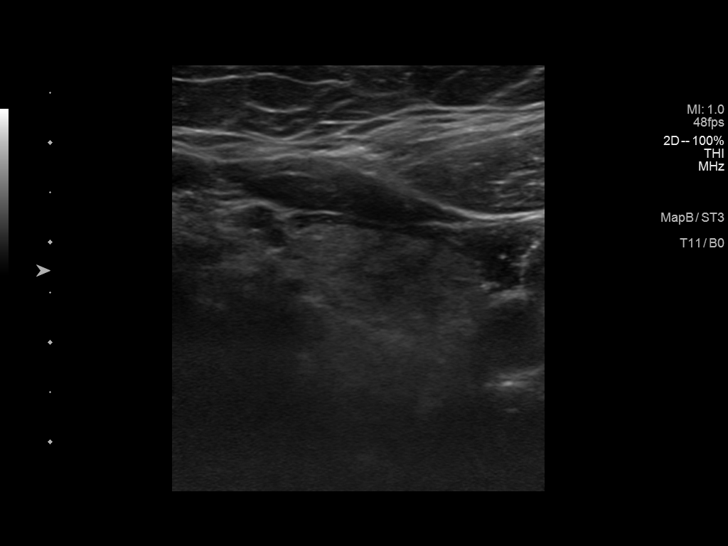
[im 14/42]
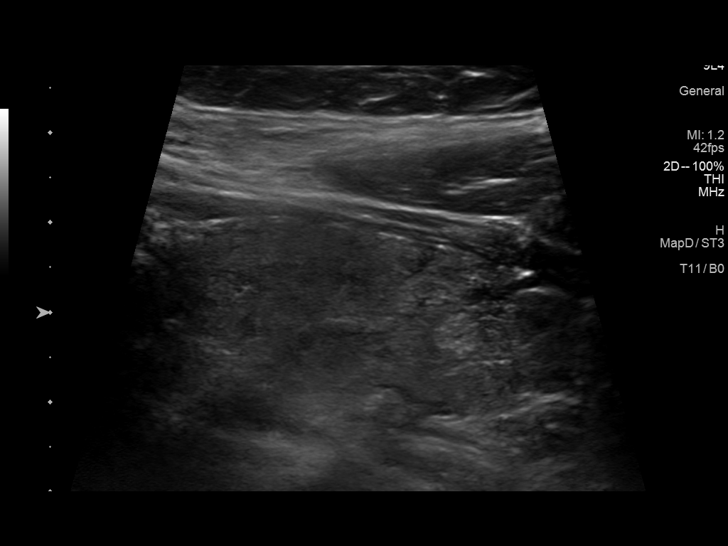
[im 18/42]
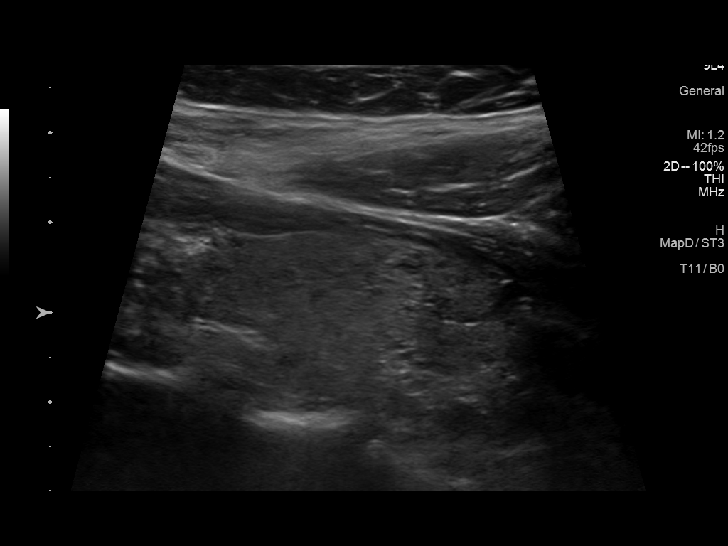
[im 21/42]
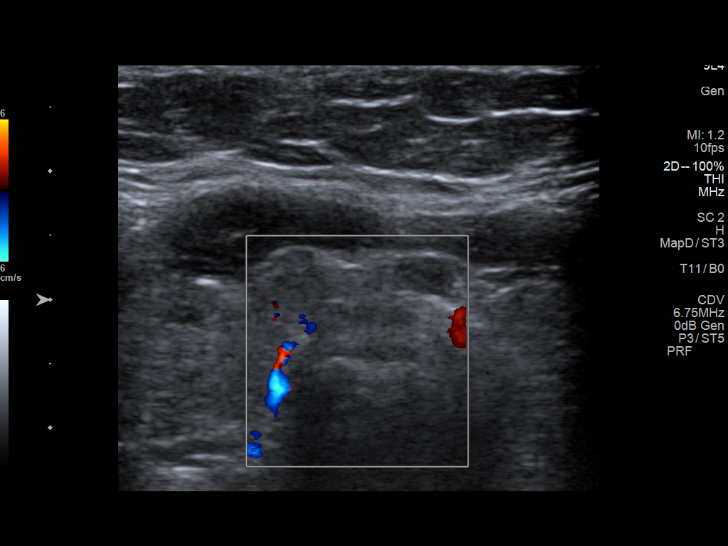
[im 24/42]
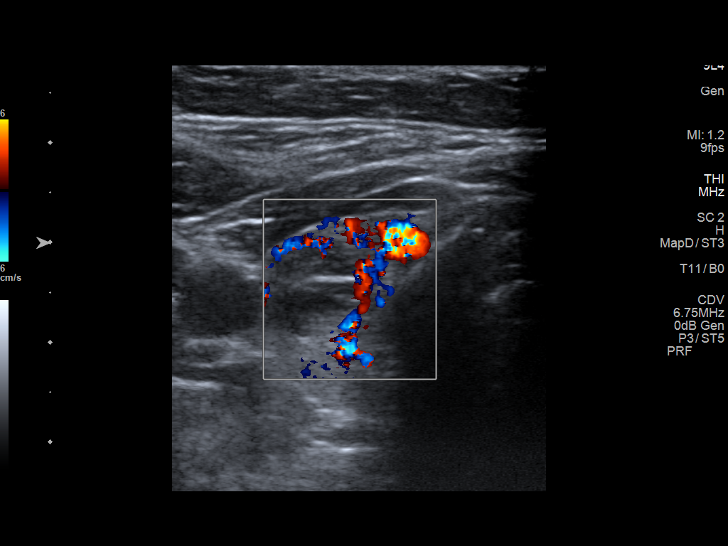
[im 28/42]
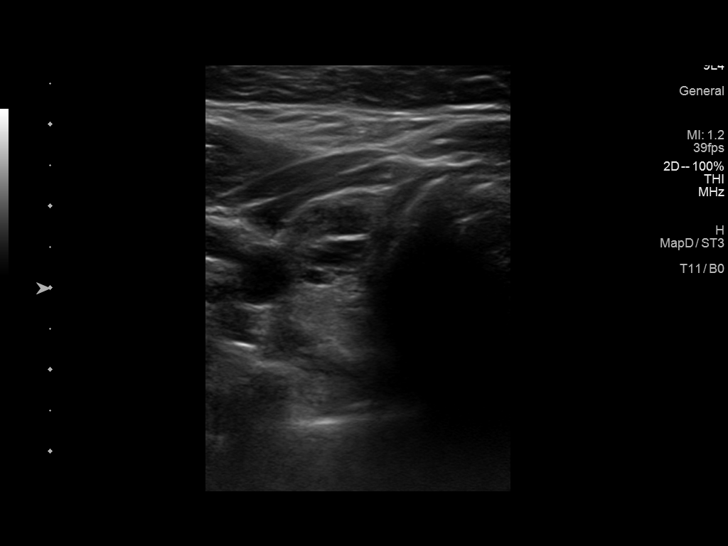
[im 31/42]
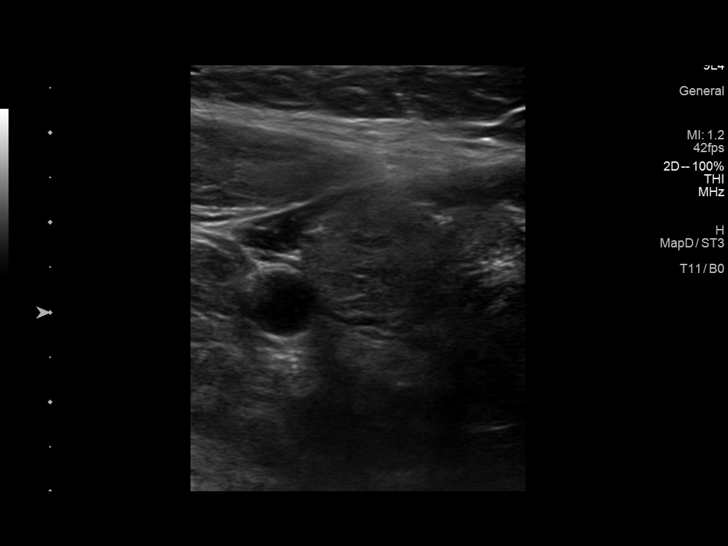
[im 35/42]
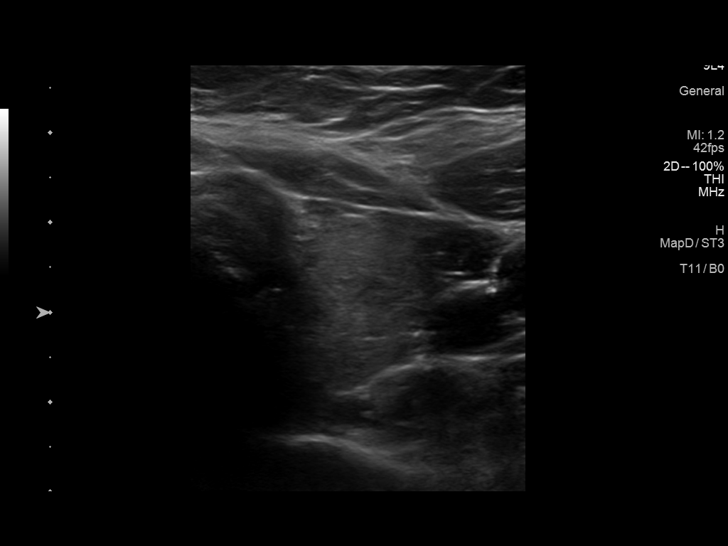
[im 38/42]
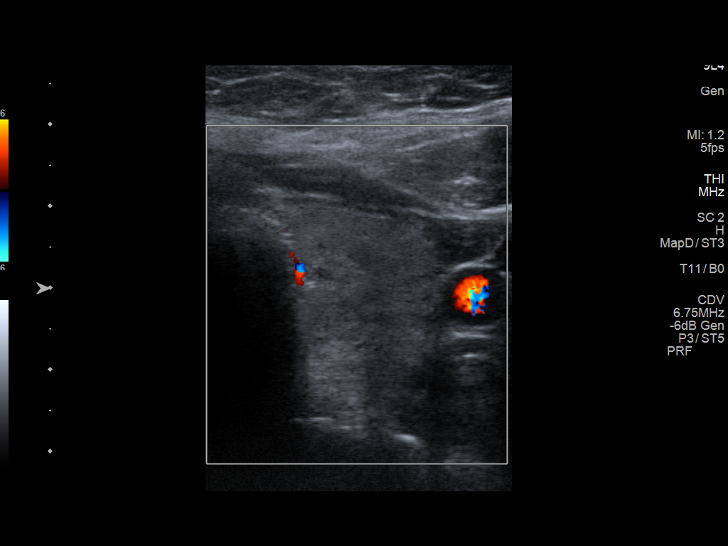
[im 42/42]
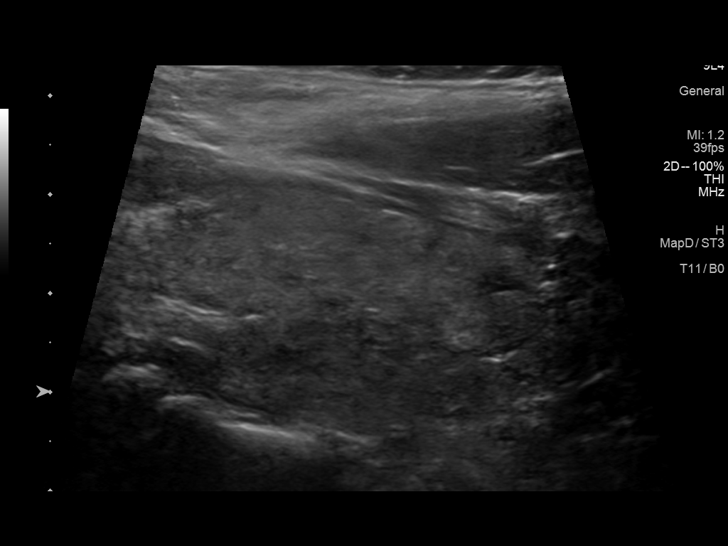

[13 of 25 positions shown; findings below may reference images not displayed]

FINDINGS: Parenchymal Echotexture: Moderately heterogenous

Isthmus: 0.3 cm

Right lobe: 5.2 x 2.4 x 2.1 cm

Left lobe: 4.4 x 1.8 x 2.0 cm

_________________________________________________________

Estimated total number of nodules >/= 1 cm: 1

Number of spongiform nodules >/=  2 cm not described below (TR1): 0

Number of mixed cystic and solid nodules >/= 1.5 cm not described
below (TR2): 0

_________________________________________________________

The previously identified nodule in the thyroid isthmus is no longer
evident and appears to have involuted since the prior study.

Nodule # 1: Minimally complex predominantly cystic nodule in the
right upper gland measures up to 1 cm. This is considered an
incidental finding and does not meet criteria to warrant biopsy or
further imaging follow-up. No further follow-up recommended.
IMPRESSION: 1. Interval involution of previously identified isthmic nodule
compared to prior imaging from 8015.
2. New minimally complex predominantly cystic nodule in the right
upper gland measures up to 1 cm. This lesion does not meet criteria
to warrant further evaluation. No further follow-up recommended.
3. Diffusely heterogeneous thyroid gland.

The above is in keeping with the ACR TI-RADS recommendations - [HOSPITAL] 8015;[DATE].

## 2023-09-20 LAB — TSH: TSH: 1.53 (ref 0.41–5.90)

## 2023-11-05 NOTE — Telephone Encounter (Signed)
Ok, I guess we can see her here with positive thyroid antibodies

## 2023-11-08 NOTE — Patient Instructions (Addendum)
Antithyroid Peroxidase Antibody Test Why am I having this test? This test is used to help in diagnosing different thyroid diseases. Your health care provider may perform this test along with other thyroid antibody tests to aid in specific diagnoses. What is being tested? This test measures the presence and level of antibodies that are produced against thyroid peroxidase, an enzyme normally found in the thyroid gland. An antibody is a type of protein that is part of the body's disease-fighting system (immune system). What kind of sample is taken?  A blood sample is required for this test. It is usually collected by inserting a needle into a blood vessel. How are the results reported? Your test results will be reported as a value. Your health care provider will compare your results to normal ranges that were established after testing a large group of people (reference ranges). Reference ranges may vary among labs and hospitals. For this test, a common reference range is: All ages: 0-9 international units/mL. What do the results mean? Increased levels of antithyroid peroxidase antibody may indicate: Hashimoto's thyroiditis. Rheumatoid arthritis (RA). Thyroid cancer. Graves' disease. Certain types of anemia. Talk with your health care provider about what your results mean. Questions to ask your health care provider Ask your health care provider, or the department that is doing the test: When will my results be ready? How will I get my results? What are my treatment options? What other tests do I need? What are my next steps? Summary The antithyroid peroxidase test is used to help in diagnosing different thyroid diseases. This test checks for antibodies that are produced against thyroid peroxidase, an enzyme normally found in the thyroid gland. Elevated levels of antithyroid peroxidase antibodies can be seen in various thyroid conditions, including Hashimoto's thyroiditis and thyroid  cancer. This information is not intended to replace advice given to you by your health care provider. Make sure you discuss any questions you have with your health care provider. Document Revised: 12/07/2021 Document Reviewed: 12/07/2021 Elsevier Patient Education  2024 [employer]       - The correct intake of thyroid hormone (Levothyroxine, Synthroid), is on empty stomach first thing in the morning, with water, separated by at least 30 minutes from breakfast and other medications,  and separated by more than 4 hours from calcium, iron, multivitamins, acid reflux medications (PPIs).  - This medication is a life-long medication and will be needed to correct thyroid hormone imbalances for the rest of your life.  The dose may change from time to time, based on thyroid blood work.  - It is extremely important to be consistent taking this medication, near the same time each morning.  -AVOID TAKING PRODUCTS CONTAINING BIOTIN (commonly found in Hair, Skin, Nails vitamins) AS IT INTERFERES WITH THE VALIDITY OF THYROID FUNCTION BLOOD TESTS.

## 2023-11-12 ENCOUNTER — Encounter: Payer: Self-pay | Admitting: Nurse Practitioner

## 2023-11-12 ENCOUNTER — Ambulatory Visit (INDEPENDENT_AMBULATORY_CARE_PROVIDER_SITE_OTHER): Payer: Commercial Managed Care - PPO | Admitting: Nurse Practitioner

## 2023-11-12 VITALS — BP 138/80 | HR 79 | Ht 65.0 in | Wt 272.4 lb

## 2023-11-12 DIAGNOSIS — E063 Autoimmune thyroiditis: Secondary | ICD-10-CM

## 2023-11-12 DIAGNOSIS — E041 Nontoxic single thyroid nodule: Secondary | ICD-10-CM | POA: Diagnosis not present

## 2023-11-12 DIAGNOSIS — R768 Other specified abnormal immunological findings in serum: Secondary | ICD-10-CM | POA: Diagnosis not present

## 2023-11-12 MED ORDER — LEVOTHYROXINE SODIUM 25 MCG PO TABS: 25.0000 ug | ORAL_TABLET | Freq: Every day | ORAL | 3 refills | Status: DC

## 2023-11-12 NOTE — Progress Notes (Signed)
Endocrinology Consult Note                                         11/12/2023, 11:44 AM  Subjective:   Subjective    Katie Moore is a 52 y.o.-year-old female patient being seen in consultation for positive thyroid antibodies and thyroid nodule referred by Katie Rutherford, NP.   Past Medical History:  Diagnosis Date   Anal fissure    Atypical mole 2015   moderate right buttock   Basal cell carcinoma 08/10/2021   sup & nod- right lower leg- anterior ([id number])   Hypertension    Hypoxia, sleep related    IBS (irritable bowel syndrome)    with diarrhea   Obesity     Past Surgical History:  Procedure Laterality Date   CESAREAN SECTION     x 2   COLONOSCOPY     MASS EXCISION N/A 06/29/2021   Procedure: EXCISION CYST ABDOMINAL WALL;  Surgeon: Franky Macho, MD;  Location: [facility];  Service: General;  Laterality: N/A;   TUBAL LIGATION  2009    Social History   Socioeconomic History   Marital status: Married    Spouse name: Not on file   Number of children: Not on file   Years of education: Not on file   Highest education level: Not on file  Occupational History   Not on file  Tobacco Use   Smoking status: Former   Smokeless tobacco: Never  Vaping Use   Vaping status: Never Used  Substance and Sexual Activity   Alcohol use: Not Currently   Drug use: Never   Sexual activity: Not on file  Other Topics Concern   Not on file  Social History Narrative   Not on file   Social Determinants of Health   Financial Resource Strain: Low Risk  (03/20/2023)   Received from [facility]   Overall Financial Resource Strain (CARDIA)    Difficulty of Paying Living Expenses: Not hard at all  Food Insecurity: No Food Insecurity (03/20/2023)   Received from [facility]   Hunger Vital Sign    Worried About Running Out of Food in the Last Year: Never true    Ran Out of Food in the Last Year: Never true   Transportation Needs: No Transportation Needs (03/20/2023)   Received from [facility] - Transportation    Lack of Transportation (Medical): No    Lack of Transportation (Non-Medical): No  Physical Activity: Inactive (01/04/2023)   Received [facility]   Exercise Vital Sign    Days of Exercise per Week: 0 days    Minutes of Exercise per Session: 0 min  Stress: No Stress Concern Present (01/04/2023)   Received from [facility] - Occupational Stress Questionnaire    Feeling of Stress : Only a little  Social Connections: Unknown (04/28/2022)   Received [facility]   Social Network    Social Network: Not  on file    Family History  Problem Relation Age of Onset   Heart disease Father    Prostate cancer Maternal Grandfather    Ulcerative colitis Maternal Grandfather    Heart disease Paternal Grandfather     Outpatient Encounter Medications as of 11/12/2023  Medication Sig   cholecalciferol (VITAMIN D3) 25 MCG (1000 UNIT) tablet Take 1,000 Units by mouth daily.   famotidine (PEPCID) 20 MG tablet Take 20 mg by mouth 2 (two) times daily.   hydrochlorothiazide (MICROZIDE) 12.5 MG capsule Take 12.5 mg by mouth daily.   levothyroxine (SYNTHROID) 25 MCG tablet Take 1 tablet (25 mcg total) by mouth daily.   lisinopril (ZESTRIL) 10 MG tablet Take 10 mg by mouth daily.   tetrahydrozoline-zinc (VISINE-AC) 0.05-0.25 % ophthalmic solution Place 2 drops into both eyes daily as needed (dry eyes).   vitamin B-12 (CYANOCOBALAMIN) 1000 MCG tablet Take 1,000 mcg by mouth daily.   [DISCONTINUED] albuterol (VENTOLIN HFA) 108 (90 Base) MCG/ACT inhaler Inhale into the lungs. (Patient not taking: Reported on 11/12/2023)   [DISCONTINUED] esomeprazole (NEXIUM) 20 MG capsule Take by mouth. (Patient not taking: Reported on 11/12/2023)   [DISCONTINUED] HYDROcodone-acetaminophen (NORCO) 5-325 MG tablet Take 1 tablet by mouth every 4 (four)  hours as needed for moderate pain. (Patient not taking: Reported on 11/12/2023)   [DISCONTINUED] hyoscyamine (LEVSIN) 0.125 MG tablet Take by mouth as needed. (Patient not taking: Reported on 11/12/2023)   [DISCONTINUED] Multiple Vitamins-Minerals (MULTIVITAMIN WITH MINERALS) tablet Take 1 tablet by mouth daily. (Patient not taking: Reported on 11/12/2023)   [DISCONTINUED] mupirocin ointment (BACTROBAN) 2 % Apply 1 application topically 2 (two) times daily. (Patient not taking: Reported on 11/12/2023)   [DISCONTINUED] nitroGLYCERIN (NITROGLYN) 2 % ointment Place onto the skin as needed. (Patient not taking: Reported on 11/12/2023)   No facility-administered encounter medications on file as of 11/12/2023.    ALLERGIES: Allergies  Allergen Reactions   Cipro [Ciprofloxacin Hcl] Rash   Biaxin [Clarithromycin] Rash   Keflex [Cephalexin] Rash   Penicillins Rash   VACCINATION STATUS: Immunization History  Administered Date(s) Administered   PFIZER(Purple Top)SARS-COV-2 Vaccination 12/23/2019, 01/20/2020, 11/11/2020, 12/08/2021     HPI   Katie Moore  is a patient with the above medical history. She recently had some testing at [facility] which showed positive TPO antibodies at 659 on 09/20/23.  I reviewed patient's thyroid tests:  Lab Results  Component Value Date   TSH 1.53 09/20/2023     Pt describes: - weight gain - fatigue - heat intolerance - dry skin - hair loss  Pt denies feeling nodules in neck, hoarseness, dysphagia/odynophagia, SOB with lying down.  she denies family history of known thyroid disorders.  No family history of thyroid cancer.  No history of radiation therapy to head or neck.  No recent use of iodine supplements.  Denies use of Biotin containing supplements.     ROS:  Constitutional: + weight gain, + fatigue, + subjective hyperthermia, no subjective hypothermia Eyes: no blurry vision, no xerophthalmia ENT: no sore throat, no nodules palpated in  throat, no dysphagia/odynophagia, no hoarseness Cardiovascular: no chest pain, no SOB, no palpitations, no leg swelling Respiratory: no cough, no SOB Gastrointestinal: no nausea/vomiting/diarrhea Musculoskeletal: no muscle/joint aches Skin: no rashes, + hair loss, + brittle nails with ridges, + dry skin Neurological: no tremors, no numbness, no tingling, no dizziness Psychiatric: no depression, no anxiety, + poor sleep, + irritability   Objective:   Objective     BP 138/80 (  BP Location: Left Arm, Patient Position: Sitting, Cuff Size: Large)   Pulse 79   Ht 5\' 5"  (1.651 m)   Wt 272 lb 6.4 oz (123.6 kg)   BMI 45.33 kg/m  Wt Readings from Last 3 Encounters:  11/12/23 272 lb 6.4 oz (123.6 kg)  07/11/21 262 lb 6 oz (119 kg)  06/24/21 258 lb 12.8 oz (117.4 kg)    BP Readings from Last 3 Encounters:  11/12/23 138/80  07/11/21 120/64  06/29/21 136/76     Constitutional:  Body mass index is 45.33 kg/m., not in acute distress, normal state of mind Eyes: PERRLA, EOMI, no exophthalmos ENT: moist mucous membranes, no thyromegaly, no cervical lymphadenopathy Cardiovascular: normal precordial activity, RRR, no murmur/rubs/gallops Respiratory:  adequate breathing efforts, no gross chest deformity, Clear to auscultation bilaterally Gastrointestinal: abdomen soft, non-tender, no distension, bowel sounds present Musculoskeletal: no gross deformities, strength intact in all four extremities Skin: moist, warm, no rashes Neurological: no tremor with outstretched hands, deep tendon reflexes normal in BLE.   CMP ( most recent) CMP  No results found for: "NA", "K", "CL", "CO2", "GLUCOSE", "BUN", "CREATININE", "CALCIUM", "PROT", "ALBUMIN", "AST", "ALT", "ALKPHOS", "BILITOT", "GFR", "EGFR", "GFRNONAA"   Diabetic Labs (most recent): No results found for: "HGBA1C", "MICROALBUR"   Lipid Panel ( most recent) Lipid Panel  No results found for: "CHOL", "TRIG", "HDL", "CHOLHDL", "VLDL",  "LDLCALC", "LDLDIRECT", "LABVLDL"     Lab Results  Component Value Date   TSH 1.53 09/20/2023            Thyroid US from 10/01/23 IMPRESSION: Enlarged heterogeneous thyroid suggesting thyroiditis.   1.  Nodule 1: ACR TI-RADS 2: Recommendation: No further follow-up is recommended.      ACR TI-RADS recommendations: TR5 (7 or more points) - FNA if greater than or equal to 1cm; if 0.5 - 0.9 cm follow-up every year for 5 years. TR4 (4-6 points) - FNA if greater than or equal to 1.5cm; if 1 - 1.4 cm follow-up in 1, 2, 3 and 5 years. TR3 (3 points) - FNA if greater than or equal to 2.5cm;  if 1.5 - 2.4 cm follow-up in 1, 3 and 5 years. TR2 (2 points) and TR1 (0 points) - No FNA or follow-up.  # ACR TI-RADS recommends that no more than two nodules with the highest ACR TI-RADS total point should be biopsied and no more than four nodules should be followed.  Electronically Signed by: Modena Jansky, MD on 10/02/2023 1:35 PM Narrative  THYROID ULTRASOUND  INDICATION: Abnormal thyroid blood test  COMPARISON: None  TECHNIQUE: Gray-scale and color Doppler images of the thyroid gland were obtained.  FINDINGS:  ISTHMUS: - Size: 0.25 cm.  RIGHT LOBE: - Size: 5.6 x 2.3 x 1.9 cm. - Echogenicity: Heterogeneous. Hypovascular.  LEFT LOBE: - Size: 5.0 x 2.2 x 2.4 cm. - Echogenicity: Heterogeneous. Hypovascular.Marland Kitchen  NODULES:  - Nodule 1: -- Size: 1.0 x 1.0 x 1.0 cm (long x AP x trans). -- Location: Right upper.  -- Composition: mixed cystic and solid (1) -- Echogenicity: isoechoic (1) -- Shape: wider-than-tall (0) -- Margins: ill-defined (0) -- Echogenic foci: none (0)  -- ACR TI-RADS total points and risk category: 2 Points - TR2.  MISCELLANEOUS: N/A. Procedure Note  Modena Jansky, MD - 10/02/2023 Formatting of this note might be different from the original. THYROID ULTRASOUND  INDICATION: Abnormal thyroid blood test  COMPARISON: None  TECHNIQUE:  Gray-scale and color Doppler images of the thyroid gland were obtained.  FINDINGS:  ISTHMUS: -  Size: 0.25 cm.  RIGHT LOBE: - Size: 5.6 x 2.3 x 1.9 cm. - Echogenicity: Heterogeneous. Hypovascular.  LEFT LOBE: - Size: 5.0 x 2.2 x 2.4 cm. - Echogenicity: Heterogeneous. Hypovascular.Marland Kitchen  NODULES:  - Nodule 1: -- Size: 1.0 x 1.0 x 1.0 cm (long x AP x trans). -- Location: Right upper.  -- Composition: mixed cystic and solid (1) -- Echogenicity: isoechoic (1) -- Shape: wider-than-tall (0) -- Margins: ill-defined (0) -- Echogenic foci: none (0)  -- ACR TI-RADS total points and risk category: 2 Points - TR2.  MISCELLANEOUS: N/A.   IMPRESSION: Enlarged heterogeneous thyroid suggesting thyroiditis.   1.  Nodule 1: ACR TI-RADS 2: Recommendation: No further follow-up is recommended.      ACR TI-RADS recommendations: TR5 (7 or more points) - FNA if greater than or equal to 1cm; if 0.5 - 0.9 cm follow-up every year for 5 years. TR4 (4-6 points) - FNA if greater than or equal to 1.5cm; if 1 - 1.4 cm follow-up in 1, 2, 3 and 5 years. TR3 (3 points) - FNA if greater than or equal to 2.5cm;  if 1.5 - 2.4 cm follow-up in 1, 3 and 5 years. TR2 (2 points) and TR1 (0 points) - No FNA or follow-up.  # ACR TI-RADS recommends that no more than two nodules with the highest ACR TI-RADS total point should be biopsied and no more than four nodules should be followed.  Electronically Signed by: Modena Jansky, MD on 10/02/2023 1:35 PM Assessment & Plan:   ASSESSMENT / PLAN:  1. Positive thyroid antibodies 2. Early Hypothyroidism- r/t Hashimoto's thyroiditis   Patient with positive thyroid antibodies, not on levothyroxine therapy. On physical , patient does not have gross goiter, thyroid nodules, or neck compression symptoms.  We discussed the risks/benefits of thyroid hormone replacement therapy and ultimately decided to proceed.  I discussed and initiated Levothyroxine 25 mcg po daily  before breakfast.   - The correct intake of thyroid hormone (Levothyroxine, Synthroid), is on empty stomach first thing in the morning, with water, separated by at least 30 minutes from breakfast and other medications,  and separated by more than 4 hours from calcium, iron, multivitamins, acid reflux medications (PPIs).  - This medication is a life-long medication and will be needed to correct thyroid hormone imbalances for the rest of your life.  The dose may change from time to time, based on thyroid blood work.  - It is extremely important to be consistent taking this medication, near the same time each morning.  -AVOID TAKING PRODUCTS CONTAINING BIOTIN (commonly found in Hair, Skin, Nails vitamins) AS IT INTERFERES WITH THE VALIDITY OF THYROID FUNCTION BLOOD TESTS.    - Will check thyroid tests before next visit: TSH, free T4    - Time spent with the patient: 45 minutes, of which >50% was spent in obtaining information about her symptoms, reviewing her previous labs, evaluations, and treatments, counseling her about her hypothyroidism, and developing a plan to confirm the diagnosis and long term treatment as necessary. Please refer to "Patient Self Inventory" in the Media tab for reviewed elements of pertinent patient history.  Leonette Monarch participated in the discussions, expressed understanding, and voiced agreement with the above plans.  All questions were answered to her satisfaction. she is encouraged to contact clinic should she have any questions or concerns prior to her return visit.   FOLLOW UP PLAN:  Return in about 8 weeks (around 01/07/2024) for Thyroid follow up, Previsit labs.  Renesmay Nesbitt  [facility] [address] [place], [place] [address] Phone: [phone] Fax: [phone]  11/12/2023, 11:44 AM

## 2024-01-03 LAB — T4, FREE: Free T4: 1.24 ng/dL (ref 0.82–1.77)

## 2024-01-03 LAB — TSH: TSH: 2.14 u[IU]/mL (ref 0.450–4.500)

## 2024-01-09 ENCOUNTER — Ambulatory Visit (INDEPENDENT_AMBULATORY_CARE_PROVIDER_SITE_OTHER): Payer: Commercial Managed Care - PPO | Admitting: Nurse Practitioner

## 2024-01-09 ENCOUNTER — Encounter: Payer: Self-pay | Admitting: Nurse Practitioner

## 2024-01-09 VITALS — BP 139/78 | HR 72 | Ht 65.0 in | Wt 274.4 lb

## 2024-01-09 DIAGNOSIS — R768 Other specified abnormal immunological findings in serum: Secondary | ICD-10-CM | POA: Diagnosis not present

## 2024-01-09 DIAGNOSIS — E041 Nontoxic single thyroid nodule: Secondary | ICD-10-CM

## 2024-01-09 DIAGNOSIS — E063 Autoimmune thyroiditis: Secondary | ICD-10-CM

## 2024-01-09 MED ORDER — LEVOTHYROXINE SODIUM 50 MCG PO TABS: 50.0000 ug | ORAL_TABLET | Freq: Every day | ORAL | 1 refills | Status: DC

## 2024-01-09 NOTE — Progress Notes (Signed)
 Endocrinology Follow Up Note                                         01/09/2024, 4:11 PM  Subjective:   Subjective    Katie Moore is a 53 y.o.-year-old female patient being seen in follow up after being seen in consultation for positive thyroid  antibodies and thyroid  nodule referred by Lorre Rosin, NP.   Past Medical History:  Diagnosis Date   Anal fissure    Atypical mole 2015   moderate right buttock   Basal cell carcinoma 08/10/2021   sup & nod- right lower leg- anterior ([id number])   Hypertension    Hypoxia, sleep related    IBS (irritable bowel syndrome)    with diarrhea   Obesity     Past Surgical History:  Procedure Laterality Date   CESAREAN SECTION     x 2   COLONOSCOPY     MASS EXCISION N/A 06/29/2021   Procedure: EXCISION CYST ABDOMINAL WALL;  Surgeon: Alanda Allegra, MD;  Location: [facility];  Service: General;  Laterality: N/A;   TUBAL LIGATION  2009    Social History   Socioeconomic History   Marital status: Married    Spouse name: Not on file   Number of children: Not on file   Years of education: Not on file   Highest education level: Not on file  Occupational History   Not on file  Tobacco Use   Smoking status: Former   Smokeless tobacco: Never  Vaping Use   Vaping status: Never Used  Substance and Sexual Activity   Alcohol use: Not Currently   Drug use: Never   Sexual activity: Not on file  Other Topics Concern   Not on file  Social History Narrative   Not on file   Social Drivers of Health   Financial Resource Strain: Low Risk  (03/20/2023)   Received from [facility]   Overall Financial Resource Strain (CARDIA)    Difficulty of Paying Living Expenses: Not hard at all  Food Insecurity: No Food Insecurity (03/20/2023)   Received from [facility]   Hunger Vital Sign    Worried About Running Out of Food in the Last Year: Never true    Ran Out of Food in the Last  Year: Never true  Transportation Needs: No Transportation Needs (03/20/2023)   Received from [facility] - Transportation    Lack of Transportation (Medical): No    Lack of Transportation (Non-Medical): No  Physical Activity: Inactive (01/04/2023)   Received from [facility]   Exercise Vital Sign    Days of Exercise per Week: 0 days    Minutes of Exercise per Session: 0 min  Stress: No Stress Concern Present (01/04/2023)   Received from [facility] - Occupational Stress Questionnaire    Feeling of Stress : Only a little  Social Connections: Unknown (04/28/2022)   Received from [facility]   Social  Network    Social Network: Not on file    Family History  Problem Relation Age of Onset   Heart disease Father    Prostate cancer Maternal Grandfather    Ulcerative colitis Maternal Grandfather    Heart disease Paternal Grandfather     Outpatient Encounter Medications as of 01/09/2024  Medication Sig   cholecalciferol (VITAMIN D3) 25 MCG (1000 UNIT) tablet Take 1,000 Units by mouth daily.   famotidine (PEPCID) 20 MG tablet Take 20 mg by mouth 2 (two) times daily.   hydrochlorothiazide (MICROZIDE) 12.5 MG capsule Take 12.5 mg by mouth daily.   lisinopril (ZESTRIL) 10 MG tablet Take 10 mg by mouth daily.   tetrahydrozoline-zinc (VISINE-AC) 0.05-0.25 % ophthalmic solution Place 2 drops into both eyes daily as needed (dry eyes).   vitamin B-12 (CYANOCOBALAMIN) 1000 MCG tablet Take 1,000 mcg by mouth daily.   [DISCONTINUED] levothyroxine  (SYNTHROID ) 25 MCG tablet Take 1 tablet (25 mcg total) by mouth daily.   levothyroxine  (SYNTHROID ) 50 MCG tablet Take 1 tablet (50 mcg total) by mouth daily before breakfast.   No facility-administered encounter medications on file as of 01/09/2024.    ALLERGIES: Allergies  Allergen Reactions   Cipro [Ciprofloxacin Hcl] Rash   Biaxin [Clarithromycin] Rash   Keflex [Cephalexin] Rash    Penicillins Rash   VACCINATION STATUS: Immunization History  Administered Date(s) Administered   PFIZER(Purple Top)SARS-COV-2 Vaccination 12/23/2019, 01/20/2020, 11/11/2020, 12/08/2021     HPI   Katie Moore  is a patient with the above medical history. She recently had some testing at [facility] which showed positive TPO antibodies at 659 on 09/20/23.  I reviewed patient's thyroid  tests:  Lab Results  Component Value Date   TSH 2.140 01/02/2024   TSH 1.53 09/20/2023   FREET4 1.24 01/02/2024     Pt describes: - weight gain - fatigue - heat intolerance - dry skin - hair loss  Pt denies feeling nodules in neck, hoarseness, dysphagia/odynophagia, SOB with lying down.  she denies family history of known thyroid  disorders.  No family history of thyroid  cancer.  No history of radiation therapy to head or neck.  No recent use of iodine supplements.  Denies use of Biotin containing supplements.   ROS:  Constitutional: + weight gain, + fatigue, + subjective hyperthermia, no subjective hypothermia Eyes: no blurry vision, no xerophthalmia ENT: no sore throat, no nodules palpated in throat, no dysphagia/odynophagia, no hoarseness Cardiovascular: no chest pain, no SOB, no palpitations, no leg swelling Respiratory: no cough, no SOB Gastrointestinal: no nausea/vomiting/diarrhea Musculoskeletal: no muscle/joint aches Skin: no rashes, + hair loss-improved, + brittle nails with ridges-improved somewhat, + dry skin-improving Neurological: no tremors, no numbness, no tingling, no dizziness Psychiatric: no depression, no anxiety   Objective:   Objective     BP 139/78 (BP Location: Left Arm, Patient Position: Sitting, Cuff Size: Large)   Pulse 72   Ht 5\' 5"  (1.651 m)   Wt 274 lb 6.4 oz (124.5 kg)   BMI 45.66 kg/m  Wt Readings from Last 3 Encounters:  01/09/24 274 lb 6.4 oz (124.5 kg)  11/12/23 272 lb 6.4 oz (123.6 kg)  07/11/21 262 lb 6 oz (119 kg)    BP Readings from  Last 3 Encounters:  01/09/24 139/78  11/12/23 138/80  07/11/21 120/64      Physical Exam- Limited  Constitutional:  Body mass index is 45.66 kg/m. , not in acute distress, normal state of mind Eyes:  EOMI, no exophthalmos Musculoskeletal: no gross  deformities, strength intact in all four extremities, no gross restriction of joint movements Skin:  no rashes, no hyperemia Neurological: no tremor with outstretched hands   CMP ( most recent) CMP  No results found for: "NA", "K", "CL", "CO2", "GLUCOSE", "BUN", "CREATININE", "CALCIUM", "PROT", "ALBUMIN", "AST", "ALT", "ALKPHOS", "BILITOT", "GFR", "EGFR", "GFRNONAA"   Diabetic Labs (most recent): No results found for: "HGBA1C", "MICROALBUR"   Lipid Panel ( most recent) Lipid Panel  No results found for: "CHOL", "TRIG", "HDL", "CHOLHDL", "VLDL", "LDLCALC", "LDLDIRECT", "LABVLDL"     Lab Results  Component Value Date   TSH 2.140 01/02/2024   TSH 1.53 09/20/2023   FREET4 1.24 01/02/2024            Thyroid  US  from 10/01/23 IMPRESSION: Enlarged heterogeneous thyroid  suggesting thyroiditis.   1.  Nodule 1: ACR TI-RADS 2: Recommendation: No further follow-up is recommended.      ACR TI-RADS recommendations: TR5 (7 or more points) - FNA if greater than or equal to 1cm; if 0.5 - 0.9 cm follow-up every year for 5 years. TR4 (4-6 points) - FNA if greater than or equal to 1.5cm; if 1 - 1.4 cm follow-up in 1, 2, 3 and 5 years. TR3 (3 points) - FNA if greater than or equal to 2.5cm;  if 1.5 - 2.4 cm follow-up in 1, 3 and 5 years. TR2 (2 points) and TR1 (0 points) - No FNA or follow-up.  # ACR TI-RADS recommends that no more than two nodules with the highest ACR TI-RADS total point should be biopsied and no more than four nodules should be followed.  Electronically Signed by: Alto Jew, MD on 10/02/2023 1:35 PM Narrative  THYROID  ULTRASOUND  INDICATION: Abnormal thyroid  blood test  COMPARISON: None  TECHNIQUE:  Gray-scale and color Doppler images of the thyroid  gland were obtained.  FINDINGS:  ISTHMUS: - Size: 0.25 cm.  RIGHT LOBE: - Size: 5.6 x 2.3 x 1.9 cm. - Echogenicity: Heterogeneous. Hypovascular.  LEFT LOBE: - Size: 5.0 x 2.2 x 2.4 cm. - Echogenicity: Heterogeneous. Hypovascular.Aaron Aas  NODULES:  - Nodule 1: -- Size: 1.0 x 1.0 x 1.0 cm (long x AP x trans). -- Location: Right upper.  -- Composition: mixed cystic and solid (1) -- Echogenicity: isoechoic (1) -- Shape: wider-than-tall (0) -- Margins: ill-defined (0) -- Echogenic foci: none (0)  -- ACR TI-RADS total points and risk category: 2 Points - TR2.  MISCELLANEOUS: N/A. Procedure Note  Alto Jew, MD - 10/02/2023 Formatting of this note might be different from the original. THYROID  ULTRASOUND  INDICATION: Abnormal thyroid  blood test  COMPARISON: None  TECHNIQUE: Gray-scale and color Doppler images of the thyroid  gland were obtained.  FINDINGS:  ISTHMUS: - Size: 0.25 cm.  RIGHT LOBE: - Size: 5.6 x 2.3 x 1.9 cm. - Echogenicity: Heterogeneous. Hypovascular.  LEFT LOBE: - Size: 5.0 x 2.2 x 2.4 cm. - Echogenicity: Heterogeneous. Hypovascular.Aaron Aas  NODULES:  - Nodule 1: -- Size: 1.0 x 1.0 x 1.0 cm (long x AP x trans). -- Location: Right upper.  -- Composition: mixed cystic and solid (1) -- Echogenicity: isoechoic (1) -- Shape: wider-than-tall (0) -- Margins: ill-defined (0) -- Echogenic foci: none (0)  -- ACR TI-RADS total points and risk category: 2 Points - TR2.  MISCELLANEOUS: N/A.   IMPRESSION: Enlarged heterogeneous thyroid  suggesting thyroiditis.   1.  Nodule 1: ACR TI-RADS 2: Recommendation: No further follow-up is recommended.      ACR TI-RADS recommendations: TR5 (7 or more points) - FNA if greater than or  equal to 1cm; if 0.5 - 0.9 cm follow-up every year for 5 years. TR4 (4-6 points) - FNA if greater than or equal to 1.5cm; if 1 - 1.4 cm follow-up in 1, 2, 3 and 5  years. TR3 (3 points) - FNA if greater than or equal to 2.5cm;  if 1.5 - 2.4 cm follow-up in 1, 3 and 5 years. TR2 (2 points) and TR1 (0 points) - No FNA or follow-up.  # ACR TI-RADS recommends that no more than two nodules with the highest ACR TI-RADS total point should be biopsied and no more than four nodules should be followed.  Electronically Signed by: Alto Jew, MD on 10/02/2023 1:35 PM   Latest Reference Range & Units 09/20/23 00:00 01/02/24 13:44  TSH 0.450 - 4.500 uIU/mL 1.53 (E) 2.140  T4,Free(Direct) 0.82 - 1.77 ng/dL  1.61  (E): External lab result  Assessment & Plan:   ASSESSMENT / PLAN:  1. Positive thyroid  antibodies 2. Early Hypothyroidism- r/t Hashimoto's thyroiditis   Patient with positive thyroid  antibodies, not on levothyroxine  therapy. On physical , patient does not have gross goiter, thyroid  nodules, or neck compression symptoms.  We discussed the risks/benefits of thyroid  hormone replacement therapy and ultimately decided to proceed.   Her previsit TFTs were normal but I think she will benefit from increase to Levothyroxine  50 mcg po daily before breakfast.    - The correct intake of thyroid  hormone (Levothyroxine , Synthroid ), is on empty stomach first thing in the morning, with water, separated by at least 30 minutes from breakfast and other medications,  and separated by more than 4 hours from calcium, iron, multivitamins, acid reflux medications (PPIs).  - This medication is a life-long medication and will be needed to correct thyroid  hormone imbalances for the rest of your life.  The dose may change from time to time, based on thyroid  blood work.  - It is extremely important to be consistent taking this medication, near the same time each morning.  -AVOID TAKING PRODUCTS CONTAINING BIOTIN (commonly found in Hair, Skin, Nails vitamins) AS IT INTERFERES WITH THE VALIDITY OF THYROID  FUNCTION BLOOD TESTS.     I spent  26  minutes in the care of the  patient today including review of labs from Thyroid  Function, CMP, and other relevant labs ; imaging/biopsy records (current and previous including abstractions from other facilities); face-to-face time discussing  her lab results and symptoms, medications doses, her options of short and long term treatment based on the latest standards of care / guidelines;   and documenting the encounter.  Katie Moore  participated in the discussions, expressed understanding, and voiced agreement with the above plans.  All questions were answered to her satisfaction. she is encouraged to contact clinic should she have any questions or concerns prior to her return visit.   FOLLOW UP PLAN:  Return in about 3 months (around 04/08/2024) for Thyroid  follow up, Previsit labs.  Hulon Magic, [facility] [address] [place], [place] [address] Phone: [phone] Fax: [phone]  01/09/2024, 4:11 PM

## 2024-01-09 NOTE — Patient Instructions (Signed)

## 2024-02-04 ENCOUNTER — Ambulatory Visit: Payer: Commercial Managed Care - PPO | Admitting: Nurse Practitioner

## 2024-04-02 LAB — TSH: TSH: 1.58 (ref 0.41–5.90)

## 2024-04-08 ENCOUNTER — Encounter: Payer: Self-pay | Admitting: Nurse Practitioner

## 2024-04-09 NOTE — Patient Instructions (Signed)

## 2024-04-14 ENCOUNTER — Ambulatory Visit (INDEPENDENT_AMBULATORY_CARE_PROVIDER_SITE_OTHER): Payer: Commercial Managed Care - PPO | Admitting: Nurse Practitioner

## 2024-04-14 ENCOUNTER — Encounter: Payer: Self-pay | Admitting: Nurse Practitioner

## 2024-04-14 VITALS — BP 112/78 | HR 70 | Ht 65.0 in | Wt 277.0 lb

## 2024-04-14 DIAGNOSIS — E063 Autoimmune thyroiditis: Secondary | ICD-10-CM | POA: Diagnosis not present

## 2024-04-14 DIAGNOSIS — E041 Nontoxic single thyroid nodule: Secondary | ICD-10-CM

## 2024-04-14 DIAGNOSIS — R768 Other specified abnormal immunological findings in serum: Secondary | ICD-10-CM | POA: Diagnosis not present

## 2024-04-14 MED ORDER — LEVOTHYROXINE SODIUM 75 MCG PO TABS: 75.0000 ug | ORAL_TABLET | Freq: Every day | ORAL | 1 refills | Status: DC

## 2024-04-14 NOTE — Progress Notes (Signed)
 Endocrinology Follow Up Note                                         04/14/2024, 10:33 AM  Subjective:   Subjective    Katie Moore is a 53 y.o.-year-old female patient being seen in follow up after being seen in consultation for positive thyroid  antibodies and thyroid  nodule referred by Katie Rosin, NP.   Past Medical History:  Diagnosis Date   Anal fissure    Atypical mole 2015   moderate right buttock   Basal cell carcinoma 08/10/2021   sup & nod- right lower leg- anterior ([id number])   Hypertension    Hypoxia, sleep related    IBS (irritable bowel syndrome)    with diarrhea   Obesity     Past Surgical History:  Procedure Laterality Date   CESAREAN SECTION     x 2   COLONOSCOPY     MASS EXCISION N/A 06/29/2021   Procedure: EXCISION CYST ABDOMINAL WALL;  Surgeon: Katie Allegra, MD;  Location: [facility];  Service: General;  Laterality: N/A;   TUBAL LIGATION  2009    Social History   Socioeconomic History   Marital status: Married    Spouse name: Not on file   Number of children: Not on file   Years of education: Not on file   Highest education level: Not on file  Occupational History   Not on file  Tobacco Use   Smoking status: Former   Smokeless tobacco: Never  Vaping Use   Vaping status: Never Used  Substance and Sexual Activity   Alcohol use: Not Currently   Drug use: Never   Sexual activity: Not on file  Other Topics Concern   Not on file  Social History Narrative   Not on file   Social Drivers of Health   Financial Resource Strain: Low Risk  (03/21/2024)   Received from [facility]   Overall Financial Resource Strain (CARDIA)    Difficulty of Paying Living Expenses: Not hard at all  Food Insecurity: No Food Insecurity (03/21/2024)   Received from [facility]   Hunger Vital Sign    Worried About Running Out of Food in the Last Year: Never true    Ran Out of Food in the Last  Year: Never true  Transportation Needs: No Transportation Needs (03/21/2024)   Received from [facility] - Transportation    Lack of Transportation (Medical): No    Lack of Transportation (Non-Medical): No  Physical Activity: Insufficiently Active (03/21/2024)   Received from [facility]   Exercise Vital Sign    Days of Exercise per Week: 3 days    Minutes of Exercise per Session: 20 min  Stress: No Stress Concern Present (03/21/2024)   Received from [facility] - Occupational Stress Questionnaire    Feeling of Stress : Only a little  Social Connections: Socially Integrated (03/21/2024)   Received from [facility]   Social  Network    How would you rate your social network (family, work, friends)?: Good participation with social networks    Family History  Problem Relation Age of Onset   Heart disease Father    Prostate cancer Maternal Grandfather    Ulcerative colitis Maternal Grandfather    Heart disease Paternal Grandfather     Outpatient Encounter Medications as of 04/14/2024  Medication Sig   cholecalciferol (VITAMIN D3) 25 MCG (1000 UNIT) tablet Take 1,000 Units by mouth daily.   famotidine (PEPCID) 20 MG tablet Take 20 mg by mouth 2 (two) times daily.   hydrochlorothiazide (MICROZIDE) 12.5 MG capsule Take 12.5 mg by mouth daily.   lisinopril (ZESTRIL) 10 MG tablet Take 10 mg by mouth daily.   tetrahydrozoline-zinc (VISINE-AC) 0.05-0.25 % ophthalmic solution Place 2 drops into both eyes daily as needed (dry eyes).   vitamin B-12 (CYANOCOBALAMIN) 1000 MCG tablet Take 1,000 mcg by mouth daily.   [DISCONTINUED] levothyroxine  (SYNTHROID ) 50 MCG tablet Take 1 tablet (50 mcg total) by mouth daily before breakfast.   levothyroxine  (SYNTHROID ) 75 MCG tablet Take 1 tablet (75 mcg total) by mouth daily before breakfast.   No facility-administered encounter medications on file as of 04/14/2024.    ALLERGIES: Allergies   Allergen Reactions   Cipro [Ciprofloxacin Hcl] Rash   Biaxin [Clarithromycin] Rash   Keflex [Cephalexin] Rash   Penicillins Rash   VACCINATION STATUS: Immunization History  Administered Date(s) Administered   PFIZER(Purple Top)SARS-COV-2 Vaccination 12/23/2019, 01/20/2020, 11/11/2020, 12/08/2021     HPI   Katie Moore  is a patient with the above medical history. She recently had some testing at [facility] which showed positive TPO antibodies at 659 on 09/20/23.  I reviewed patient's thyroid  tests:  Lab Results  Component Value Date   TSH 1.58 04/02/2024   TSH 2.140 01/02/2024   TSH 1.53 09/20/2023   FREET4 1.24 01/02/2024     Pt describes: - weight gain - fatigue - heat intolerance - dry skin - hair loss  Pt denies feeling nodules in neck, hoarseness, dysphagia/odynophagia, SOB with lying down.  she denies family history of known thyroid  disorders.  No family history of thyroid  cancer.  No history of radiation therapy to head or neck.  No recent use of iodine supplements.  Denies use of Biotin containing supplements.   ROS:  Constitutional: + weight gain, + fatigue-somewhat improved, + subjective hyperthermia-intermittent, no subjective hypothermia Eyes: no blurry vision, no xerophthalmia ENT: no sore throat, no nodules palpated in throat, no dysphagia/odynophagia, no hoarseness Cardiovascular: no chest pain, no SOB, no palpitations, no leg swelling Respiratory: no cough, no SOB Gastrointestinal: no nausea/vomiting/diarrhea Musculoskeletal: no muscle/joint aches Skin: no rashes, + hair loss-improved, + brittle nails with ridges-improved somewhat, + dry skin-improving Neurological: no tremors, no numbness, no tingling, no dizziness Psychiatric: no depression, no anxiety   Objective:   Objective     BP 112/78 (BP Location: Left Arm, Patient Position: Sitting, Cuff Size: Large)   Pulse 70   Ht 5\' 5"  (1.651 m)   Wt 277 lb (125.6 kg)   BMI 46.10 kg/m   Wt Readings from Last 3 Encounters:  04/14/24 277 lb (125.6 kg)  01/09/24 274 lb 6.4 oz (124.5 kg)  11/12/23 272 lb 6.4 oz (123.6 kg)    BP Readings from Last 3 Encounters:  04/14/24 112/78  01/09/24 139/78  11/12/23 138/80       Physical Exam- Limited  Constitutional:  Body mass index is 46.1 kg/m. , not  in acute distress, normal state of mind Eyes:  EOMI, no exophthalmos Musculoskeletal: no gross deformities, strength intact in all four extremities, no gross restriction of joint movements Skin:  no rashes, no hyperemia Neurological: no tremor with outstretched hands   CMP ( most recent) CMP  No results found for: "NA", "K", "CL", "CO2", "GLUCOSE", "BUN", "CREATININE", "CALCIUM", "PROT", "ALBUMIN", "AST", "ALT", "ALKPHOS", "BILITOT", "GFR", "EGFR", "GFRNONAA"   Diabetic Labs (most recent): No results found for: "HGBA1C", "MICROALBUR"   Lipid Panel ( most recent) Lipid Panel  No results found for: "CHOL", "TRIG", "HDL", "CHOLHDL", "VLDL", "LDLCALC", "LDLDIRECT", "LABVLDL"     Lab Results  Component Value Date   TSH 1.58 04/02/2024   TSH 2.140 01/02/2024   TSH 1.53 09/20/2023   FREET4 1.24 01/02/2024            Thyroid  US  from 10/01/23 IMPRESSION: Enlarged heterogeneous thyroid  suggesting thyroiditis.   1.  Nodule 1: ACR TI-RADS 2: Recommendation: No further follow-up is recommended.      ACR TI-RADS recommendations: TR5 (7 or more points) - FNA if greater than or equal to 1cm; if 0.5 - 0.9 cm follow-up every year for 5 years. TR4 (4-6 points) - FNA if greater than or equal to 1.5cm; if 1 - 1.4 cm follow-up in 1, 2, 3 and 5 years. TR3 (3 points) - FNA if greater than or equal to 2.5cm;  if 1.5 - 2.4 cm follow-up in 1, 3 and 5 years. TR2 (2 points) and TR1 (0 points) - No FNA or follow-up.  # ACR TI-RADS recommends that no more than two nodules with the highest ACR TI-RADS total point should be biopsied and no more than four nodules should be  followed.  Electronically Signed by: Alto Jew, MD on 10/02/2023 1:35 PM Narrative  THYROID  ULTRASOUND  INDICATION: Abnormal thyroid  blood test  COMPARISON: None  TECHNIQUE: Gray-scale and color Doppler images of the thyroid  gland were obtained.  FINDINGS:  ISTHMUS: - Size: 0.25 cm.  RIGHT LOBE: - Size: 5.6 x 2.3 x 1.9 cm. - Echogenicity: Heterogeneous. Hypovascular.  LEFT LOBE: - Size: 5.0 x 2.2 x 2.4 cm. - Echogenicity: Heterogeneous. Hypovascular.Aaron Aas  NODULES:  - Nodule 1: -- Size: 1.0 x 1.0 x 1.0 cm (long x AP x trans). -- Location: Right upper.  -- Composition: mixed cystic and solid (1) -- Echogenicity: isoechoic (1) -- Shape: wider-than-tall (0) -- Margins: ill-defined (0) -- Echogenic foci: none (0)  -- ACR TI-RADS total points and risk category: 2 Points - TR2.  MISCELLANEOUS: N/A. Procedure Note  Alto Jew, MD - 10/02/2023 Formatting of this note might be different from the original. THYROID  ULTRASOUND  INDICATION: Abnormal thyroid  blood test  COMPARISON: None  TECHNIQUE: Gray-scale and color Doppler images of the thyroid  gland were obtained.  FINDINGS:  ISTHMUS: - Size: 0.25 cm.  RIGHT LOBE: - Size: 5.6 x 2.3 x 1.9 cm. - Echogenicity: Heterogeneous. Hypovascular.  LEFT LOBE: - Size: 5.0 x 2.2 x 2.4 cm. - Echogenicity: Heterogeneous. Hypovascular.Aaron Aas  NODULES:  - Nodule 1: -- Size: 1.0 x 1.0 x 1.0 cm (long x AP x trans). -- Location: Right upper.  -- Composition: mixed cystic and solid (1) -- Echogenicity: isoechoic (1) -- Shape: wider-than-tall (0) -- Margins: ill-defined (0) -- Echogenic foci: none (0)  -- ACR TI-RADS total points and risk category: 2 Points - TR2.  MISCELLANEOUS: N/A.   IMPRESSION: Enlarged heterogeneous thyroid  suggesting thyroiditis.   1.  Nodule 1: ACR TI-RADS 2: Recommendation: No further follow-up is  recommended.      ACR TI-RADS recommendations: TR5 (7 or more points) - FNA  if greater than or equal to 1cm; if 0.5 - 0.9 cm follow-up every year for 5 years. TR4 (4-6 points) - FNA if greater than or equal to 1.5cm; if 1 - 1.4 cm follow-up in 1, 2, 3 and 5 years. TR3 (3 points) - FNA if greater than or equal to 2.5cm;  if 1.5 - 2.4 cm follow-up in 1, 3 and 5 years. TR2 (2 points) and TR1 (0 points) - No FNA or follow-up.  # ACR TI-RADS recommends that no more than two nodules with the highest ACR TI-RADS total point should be biopsied and no more than four nodules should be followed.  Electronically Signed by: Alto Jew, MD on 10/02/2023 1:35 PM   Latest Reference Range & Units 09/20/23 00:00 01/02/24 13:44 04/02/24 00:00  TSH 0.41 - 5.90  1.53 (E) 2.140 1.58 (E)  T4,Free(Direct) 0.82 - 1.77 ng/dL  8.46   (E): External lab result    Assessment & Plan:   ASSESSMENT / PLAN:  1. Early Hypothyroidism- r/t Hashimoto's thyroiditis   Patient with positive thyroid  antibodies, not on levothyroxine  therapy. On physical , patient does not have gross goiter, thyroid  nodules, or neck compression symptoms.  We discussed the risks/benefits of thyroid  hormone replacement therapy and ultimately decided to proceed.   Her previsit TFTs are consistent with appropriate hormone replacement, but I think she will benefit from increase to Levothyroxine  75 mcg po daily before breakfast.  She is aware of symptoms of over-replacement to watch for.  Will recheck prior to next visit and adjust dose accordingly.   - The correct intake of thyroid  hormone (Levothyroxine , Synthroid ), is on empty stomach first thing in the morning, with water, separated by at least 30 minutes from breakfast and other medications,  and separated by more than 4 hours from calcium, iron, multivitamins, acid reflux medications (PPIs).  - This medication is a life-long medication and will be needed to correct thyroid  hormone imbalances for the rest of your life.  The dose may change from time to time, based  on thyroid  blood work.  - It is extremely important to be consistent taking this medication, near the same time each morning.  -AVOID TAKING PRODUCTS CONTAINING BIOTIN (commonly found in Hair, Skin, Nails vitamins) AS IT INTERFERES WITH THE VALIDITY OF THYROID  FUNCTION BLOOD TESTS.    I spent  22  minutes in the care of the patient today including review of labs from Thyroid  Function, CMP, and other relevant labs ; imaging/biopsy records (current and previous including abstractions from other facilities); face-to-face time discussing  her lab results and symptoms, medications doses, her options of short and long term treatment based on the latest standards of care / guidelines;   and documenting the encounter.  Aurelia Blotter  participated in the discussions, expressed understanding, and voiced agreement with the above plans.  All questions were answered to her satisfaction. she is encouraged to contact clinic should she have any questions or concerns prior to her return visit.   FOLLOW UP PLAN:  Return in about 4 months (around 08/14/2024) for Thyroid  follow up, Previsit labs.  Hulon Magic, [facility] [address] [place], [place] [address] Phone: [phone] Fax: [phone]  04/14/2024, 10:33 AM

## 2024-08-05 LAB — TSH: TSH: 1.33 (ref 0.41–5.90)

## 2024-08-13 ENCOUNTER — Encounter: Payer: Self-pay | Admitting: Nurse Practitioner

## 2024-08-14 ENCOUNTER — Encounter: Payer: Self-pay | Admitting: Nurse Practitioner

## 2024-08-14 ENCOUNTER — Ambulatory Visit (INDEPENDENT_AMBULATORY_CARE_PROVIDER_SITE_OTHER): Admitting: Nurse Practitioner

## 2024-08-14 VITALS — BP 124/84 | HR 67 | Ht 65.0 in | Wt 266.6 lb

## 2024-08-14 DIAGNOSIS — E041 Nontoxic single thyroid nodule: Secondary | ICD-10-CM | POA: Diagnosis not present

## 2024-08-14 DIAGNOSIS — E063 Autoimmune thyroiditis: Secondary | ICD-10-CM | POA: Diagnosis not present

## 2024-08-14 NOTE — Progress Notes (Signed)
 Endocrinology Follow Up Note                                         08/14/2024, 9:49 AM  Subjective:   Subjective    Katie Moore is a 53 y.o.-year-old female patient being seen in follow up after being seen in consultation for positive thyroid  antibodies and thyroid  nodule referred by Suanne Pfeiffer, NP.   Past Medical History:  Diagnosis Date   Anal fissure    Atypical mole 2015   moderate right buttock   Basal cell carcinoma 08/10/2021   sup & nod- right lower leg- anterior ([id number])   Hypertension    Hypoxia, sleep related    IBS (irritable bowel syndrome)    with diarrhea   Obesity     Past Surgical History:  Procedure Laterality Date   CESAREAN SECTION     x 2   COLONOSCOPY     MASS EXCISION N/A 06/29/2021   Procedure: EXCISION CYST ABDOMINAL WALL;  Surgeon: Mavis Anes, MD;  Location: [facility];  Service: General;  Laterality: N/A;   TUBAL LIGATION  2009    Social History   Socioeconomic History   Marital status: Married    Spouse name: Not on file   Number of children: Not on file   Years of education: Not on file   Highest education level: Not on file  Occupational History   Not on file  Tobacco Use   Smoking status: Former   Smokeless tobacco: Never  Vaping Use   Vaping status: Never Used  Substance and Sexual Activity   Alcohol use: Not Currently   Drug use: Never   Sexual activity: Not on file  Other Topics Concern   Not on file  Social History Narrative   Not on file   Social Drivers of Health   Financial Resource Strain: Low Risk  (03/21/2024)   Received from [facility]   Overall Financial Resource Strain (CARDIA)    Difficulty of Paying Living Expenses: Not hard at all  Food Insecurity: No Food Insecurity (03/21/2024)   Received from [facility]   Hunger Vital Sign    Within the past 12 months, you worried that your food would run out before you got the money to  buy more.: Never true    Within the past 12 months, the food you bought just didn't last and you didn't have money to get more.: Never true  Transportation Needs: No Transportation Needs (03/21/2024)   Received from [facility] - Transportation    Lack of Transportation (Medical): No    Lack of Transportation (Non-Medical): No  Physical Activity: Insufficiently Active (03/21/2024)   Received from [facility]   Exercise Vital Sign    On average, how many days per week do you engage in moderate to strenuous exercise (like a brisk walk)?: 3 days    On average, how many minutes do you engage in exercise at this level?: 20 min  Stress: No Stress  Concern Present (03/21/2024)   Received from [facility] - Occupational Stress Questionnaire    Feeling of Stress : Only a little  Social Connections: Socially Integrated (03/21/2024)   Received from [facility]   Social Network    How would you rate your social network (family, work, friends)?: Good participation with social networks    Family History  Problem Relation Age of Onset   Heart disease Father    Prostate cancer Maternal Grandfather    Ulcerative colitis Maternal Grandfather    Heart disease Paternal Grandfather     Outpatient Encounter Medications as of 08/14/2024  Medication Sig   cholecalciferol (VITAMIN D3) 25 MCG (1000 UNIT) tablet Take 1,000 Units by mouth daily.   famotidine (PEPCID) 20 MG tablet Take 20 mg by mouth 2 (two) times daily.   hydrochlorothiazide (MICROZIDE) 12.5 MG capsule Take 12.5 mg by mouth daily.   levothyroxine  (SYNTHROID ) 75 MCG tablet Take 1 tablet (75 mcg total) by mouth daily before breakfast.   lisinopril (ZESTRIL) 10 MG tablet Take 10 mg by mouth daily.   tetrahydrozoline-zinc (VISINE-AC) 0.05-0.25 % ophthalmic solution Place 2 drops into both eyes daily as needed (dry eyes).   vitamin B-12 (CYANOCOBALAMIN) 1000 MCG tablet Take 1,000 mcg by  mouth daily.   ZEPBOUND 5 MG/0.5ML Pen Inject 5 mg into the skin once a week.   No facility-administered encounter medications on file as of 08/14/2024.    ALLERGIES: Allergies  Allergen Reactions   Cipro [Ciprofloxacin Hcl] Rash   Biaxin [Clarithromycin] Rash   Keflex [Cephalexin] Rash   Penicillins Rash   VACCINATION STATUS: Immunization History  Administered Date(s) Administered   PFIZER(Purple Top)SARS-COV-2 Vaccination 12/23/2019, 01/20/2020, 11/11/2020, 12/08/2021     HPI   Katie Moore  is a patient with the above medical history. She recently had some testing at [facility] which showed positive TPO antibodies at 659 on 09/20/23.  I reviewed patient's thyroid  tests:  Lab Results  Component Value Date   TSH 1.33 08/05/2024   TSH 1.58 04/02/2024   TSH 2.140 01/02/2024   TSH 1.53 09/20/2023   FREET4 1.24 01/02/2024     Pt describes: - weight gain - fatigue - heat intolerance - dry skin - hair loss  Pt denies feeling nodules in neck, hoarseness, dysphagia/odynophagia, SOB with lying down.  she denies family history of known thyroid  disorders.  No family history of thyroid  cancer.  No history of radiation therapy to head or neck.  No recent use of iodine supplements.  Denies use of Biotin containing supplements.   ROS:  Constitutional: decreasing body weight (now on Zepbound), + fatigue-somewhat improved, no subjective hypothermia Eyes: no blurry vision, no xerophthalmia ENT: no sore throat, no nodules palpated in throat, no dysphagia/odynophagia, no hoarseness Cardiovascular: no chest pain, no SOB, no palpitations, no leg swelling Respiratory: no cough, no SOB Gastrointestinal: no nausea/vomiting/diarrhea Musculoskeletal: no muscle/joint aches Skin: no rashes, + hair loss-unchanged, + brittle nails with ridges-improved somewhat, + dry skin-stable Neurological: no tremors, no numbness, no tingling, no dizziness Psychiatric: no depression, no  anxiety   Objective:   Objective     BP 124/84 (BP Location: Left Arm, Patient Position: Sitting, Cuff Size: Large)   Pulse 67   Ht 5' 5 (1.651 m)   Wt 266 lb 9.6 oz (120.9 kg)   BMI 44.36 kg/m  Wt Readings from Last 3 Encounters:  08/14/24 266 lb 9.6 oz (120.9 kg)  04/14/24 277 lb (  125.6 kg)  01/09/24 274 lb 6.4 oz (124.5 kg)    BP Readings from Last 3 Encounters:  08/14/24 124/84  04/14/24 112/78  01/09/24 139/78      Physical Exam- Limited  Constitutional:  Body mass index is 44.36 kg/m. , not in acute distress, normal state of mind Eyes:  EOMI, no exophthalmos Musculoskeletal: no gross deformities, strength intact in all four extremities, no gross restriction of joint movements Skin:  no rashes, no hyperemia Neurological: no tremor with outstretched hands   CMP ( most recent) CMP  No results found for: NA, K, CL, CO2, GLUCOSE, BUN, CREATININE, CALCIUM, PROT, ALBUMIN, AST, ALT, ALKPHOS, BILITOT, GFR, EGFR, GFRNONAA   Diabetic Labs (most recent): No results found for: HGBA1C, MICROALBUR   Lipid Panel ( most recent) Lipid Panel  No results found for: CHOL, TRIG, HDL, CHOLHDL, VLDL, LDLCALC, LDLDIRECT, LABVLDL     Lab Results  Component Value Date   TSH 1.33 08/05/2024   TSH 1.58 04/02/2024   TSH 2.140 01/02/2024   TSH 1.53 09/20/2023   FREET4 1.24 01/02/2024            Thyroid  US  from 10/01/23 IMPRESSION: Enlarged heterogeneous thyroid  suggesting thyroiditis.   1.  Nodule 1: ACR TI-RADS 2: Recommendation: No further follow-up is recommended.      ACR TI-RADS recommendations: TR5 (7 or more points) - FNA if greater than or equal to 1cm; if 0.5 - 0.9 cm follow-up every year for 5 years. TR4 (4-6 points) - FNA if greater than or equal to 1.5cm; if 1 - 1.4 cm follow-up in 1, 2, 3 and 5 years. TR3 (3 points) - FNA if greater than or equal to 2.5cm;  if 1.5 - 2.4 cm follow-up in 1, 3 and 5  years. TR2 (2 points) and TR1 (0 points) - No FNA or follow-up.  # ACR TI-RADS recommends that no more than two nodules with the highest ACR TI-RADS total point should be biopsied and no more than four nodules should be followed.  Electronically Signed by: Grayce Linear, MD on 10/02/2023 1:35 PM Narrative  THYROID  ULTRASOUND  INDICATION: Abnormal thyroid  blood test  COMPARISON: None  TECHNIQUE: Gray-scale and color Doppler images of the thyroid  gland were obtained.  FINDINGS:  ISTHMUS: - Size: 0.25 cm.  RIGHT LOBE: - Size: 5.6 x 2.3 x 1.9 cm. - Echogenicity: Heterogeneous. Hypovascular.  LEFT LOBE: - Size: 5.0 x 2.2 x 2.4 cm. - Echogenicity: Heterogeneous. Hypovascular.SABRA  NODULES:  - Nodule 1: -- Size: 1.0 x 1.0 x 1.0 cm (long x AP x trans). -- Location: Right upper.  -- Composition: mixed cystic and solid (1) -- Echogenicity: isoechoic (1) -- Shape: wider-than-tall (0) -- Margins: ill-defined (0) -- Echogenic foci: none (0)  -- ACR TI-RADS total points and risk category: 2 Points - TR2.  MISCELLANEOUS: N/A. Procedure Note  Linear Grayce, MD - 10/02/2023 Formatting of this note might be different from the original. THYROID  ULTRASOUND  INDICATION: Abnormal thyroid  blood test  COMPARISON: None  TECHNIQUE: Gray-scale and color Doppler images of the thyroid  gland were obtained.  FINDINGS:  ISTHMUS: - Size: 0.25 cm.  RIGHT LOBE: - Size: 5.6 x 2.3 x 1.9 cm. - Echogenicity: Heterogeneous. Hypovascular.  LEFT LOBE: - Size: 5.0 x 2.2 x 2.4 cm. - Echogenicity: Heterogeneous. Hypovascular.SABRA  NODULES:  - Nodule 1: -- Size: 1.0 x 1.0 x 1.0 cm (long x AP x trans). -- Location: Right upper.  -- Composition: mixed cystic and solid (1) -- Echogenicity: isoechoic (  1) -- Shape: wider-than-tall (0) -- Margins: ill-defined (0) -- Echogenic foci: none (0)  -- ACR TI-RADS total points and risk category: 2 Points -  TR2.  MISCELLANEOUS: N/A.   IMPRESSION: Enlarged heterogeneous thyroid  suggesting thyroiditis.   1.  Nodule 1: ACR TI-RADS 2: Recommendation: No further follow-up is recommended.      ACR TI-RADS recommendations: TR5 (7 or more points) - FNA if greater than or equal to 1cm; if 0.5 - 0.9 cm follow-up every year for 5 years. TR4 (4-6 points) - FNA if greater than or equal to 1.5cm; if 1 - 1.4 cm follow-up in 1, 2, 3 and 5 years. TR3 (3 points) - FNA if greater than or equal to 2.5cm;  if 1.5 - 2.4 cm follow-up in 1, 3 and 5 years. TR2 (2 points) and TR1 (0 points) - No FNA or follow-up.  # ACR TI-RADS recommends that no more than two nodules with the highest ACR TI-RADS total point should be biopsied and no more than four nodules should be followed.  Electronically Signed by: Grayce Linear, MD on 10/02/2023 1:35 PM   Latest Reference Range & Units 09/20/23 00:00 01/02/24 13:44 04/02/24 00:00 08/05/24 00:00  TSH 0.41 - 5.90  1.53 (E) 2.140 1.58 (E) 1.33 (E)  T4,Free(Direct) 0.82 - 1.77 ng/dL  8.75    (E): External lab result    Assessment & Plan:   ASSESSMENT / PLAN:  1. Early Hypothyroidism- r/t Hashimoto's thyroiditis  Patient with positive thyroid  antibodies, not on levothyroxine  therapy. On physical , patient does not have gross goiter, thyroid  nodules, or neck compression symptoms.  We discussed the risks/benefits of thyroid  hormone replacement therapy and ultimately decided to proceed.   Her previsit TFTs are consistent with appropriate hormone replacement.  She is advised to continue Levothyroxine  75 mcg po daily before breakfast.   - The correct intake of thyroid  hormone (Levothyroxine , Synthroid ), is on empty stomach first thing in the morning, with water, separated by at least 30 minutes from breakfast and other medications,  and separated by more than 4 hours from calcium, iron, multivitamins, acid reflux medications (PPIs).  - This medication is a life-long  medication and will be needed to correct thyroid  hormone imbalances for the rest of your life.  The dose may change from time to time, based on thyroid  blood work.  - It is extremely important to be consistent taking this medication, near the same time each morning.  -AVOID TAKING PRODUCTS CONTAINING BIOTIN (commonly found in Hair, Skin, Nails vitamins) AS IT INTERFERES WITH THE VALIDITY OF THYROID  FUNCTION BLOOD TESTS.     I spent  32  minutes in the care of the patient today including review of labs from Thyroid  Function, CMP, and other relevant labs ; imaging/biopsy records (current and previous including abstractions from other facilities); face-to-face time discussing  her lab results and symptoms, medications doses, her options of short and long term treatment based on the latest standards of care / guidelines;   and documenting the encounter.  Elenor MARLA Parents  participated in the discussions, expressed understanding, and voiced agreement with the above plans.  All questions were answered to her satisfaction. she is encouraged to contact clinic should she have any questions or concerns prior to her return visit.   FOLLOW UP PLAN:  Return in about 6 months (around 02/14/2025) for Thyroid  follow up, Previsit labs.  Benton Rio, [facility] [address] [place], [place] [address] Phone: [phone] Fax: [phone]  08/14/2024, 9:49 AM

## 2024-08-14 NOTE — Patient Instructions (Signed)

## 2024-10-06 ENCOUNTER — Other Ambulatory Visit: Payer: Self-pay | Admitting: Nurse Practitioner

## 2024-10-06 ENCOUNTER — Encounter: Payer: Self-pay | Admitting: Nurse Practitioner

## 2024-10-06 DIAGNOSIS — E063 Autoimmune thyroiditis: Secondary | ICD-10-CM

## 2024-10-06 DIAGNOSIS — R7689 Other specified abnormal immunological findings in serum: Secondary | ICD-10-CM

## 2024-10-06 DIAGNOSIS — E041 Nontoxic single thyroid nodule: Secondary | ICD-10-CM

## 2024-10-06 MED ORDER — LEVOTHYROXINE SODIUM 75 MCG PO TABS: 75.0000 ug | ORAL_TABLET | Freq: Every day | ORAL | 1 refills | Status: AC

## 2025-02-16 ENCOUNTER — Ambulatory Visit: Admitting: Nurse Practitioner
